# Patient Record
Sex: Female | Born: 1950
Health system: Southern US, Community
[De-identification: ages and names within clinical notes are randomized; demographics above are authoritative.]

## PROBLEM LIST (undated history)

## (undated) DIAGNOSIS — I10 Essential (primary) hypertension: Secondary | ICD-10-CM

## (undated) DIAGNOSIS — E7800 Pure hypercholesterolemia, unspecified: Secondary | ICD-10-CM

## (undated) DIAGNOSIS — I639 Cerebral infarction, unspecified: Secondary | ICD-10-CM

## (undated) DIAGNOSIS — E119 Type 2 diabetes mellitus without complications: Secondary | ICD-10-CM

## (undated) HISTORY — PX: HEMORRHOID SURGERY: SHX153

## (undated) HISTORY — DX: Type 2 diabetes mellitus without complications: E11.9

## (undated) HISTORY — DX: Cerebral infarction, unspecified: I63.9

## (undated) HISTORY — DX: Essential (primary) hypertension: I10

---

## 2007-09-27 ENCOUNTER — Emergency Department (HOSPITAL_COMMUNITY): Admission: EM | Admit: 2007-09-27 | Discharge: 2007-09-27 | Payer: Self-pay | Admitting: Emergency Medicine

## 2011-04-15 ENCOUNTER — Other Ambulatory Visit (HOSPITAL_COMMUNITY): Payer: Self-pay | Admitting: Nurse Practitioner

## 2011-04-15 DIAGNOSIS — R1011 Right upper quadrant pain: Secondary | ICD-10-CM

## 2011-04-20 ENCOUNTER — Ambulatory Visit (HOSPITAL_COMMUNITY)
Admission: RE | Admit: 2011-04-20 | Discharge: 2011-04-20 | Disposition: A | Discharge status: Home / Self Care | Payer: Self-pay | Source: Ambulatory Visit | Attending: Nurse Practitioner | Admitting: Nurse Practitioner

## 2011-04-20 DIAGNOSIS — Q619 Cystic kidney disease, unspecified: Secondary | ICD-10-CM | POA: Insufficient documentation

## 2011-04-20 DIAGNOSIS — R1011 Right upper quadrant pain: Secondary | ICD-10-CM | POA: Insufficient documentation

## 2013-08-27 ENCOUNTER — Encounter: Payer: Self-pay | Admitting: Gastroenterology

## 2017-11-16 ENCOUNTER — Emergency Department: Payer: BLUE CROSS/BLUE SHIELD

## 2017-11-16 ENCOUNTER — Emergency Department
Admission: EM | Admit: 2017-11-16 | Discharge: 2017-11-16 | Disposition: A | Discharge status: Home / Self Care | Payer: BLUE CROSS/BLUE SHIELD | Attending: Emergency Medicine | Admitting: Emergency Medicine

## 2017-11-16 ENCOUNTER — Encounter: Payer: Self-pay | Admitting: Emergency Medicine

## 2017-11-16 DIAGNOSIS — R0789 Other chest pain: Secondary | ICD-10-CM | POA: Insufficient documentation

## 2017-11-16 DIAGNOSIS — R079 Chest pain, unspecified: Secondary | ICD-10-CM | POA: Diagnosis present

## 2017-11-16 HISTORY — DX: Pure hypercholesterolemia, unspecified: E78.00

## 2017-11-16 LAB — CBC
HEMATOCRIT: 44.7 % (ref 36.0–46.0)
HEMOGLOBIN: 15.2 g/dL — AB (ref 12.0–15.0)
MCH: 29.8 pg (ref 26.0–34.0)
MCHC: 34 g/dL (ref 30.0–36.0)
MCV: 87.6 fL (ref 80.0–100.0)
Platelets: 226 10*3/uL (ref 150–400)
RBC: 5.1 MIL/uL (ref 3.87–5.11)
RDW: 11.9 % (ref 11.5–15.5)
WBC: 5.4 10*3/uL (ref 4.0–10.5)
nRBC: 0 % (ref 0.0–0.2)

## 2017-11-16 LAB — BASIC METABOLIC PANEL
Anion gap: 10 (ref 5–15)
BUN: 13 mg/dL (ref 8–23)
CHLORIDE: 99 mmol/L (ref 98–111)
CO2: 26 mmol/L (ref 22–32)
CREATININE: 0.69 mg/dL (ref 0.44–1.00)
Calcium: 9.6 mg/dL (ref 8.9–10.3)
GFR calc Af Amer: >60 mL/min (ref >60)
GLUCOSE: 208 mg/dL — AB (ref 70–99)
Potassium: 3.7 mmol/L (ref 3.5–5.1)
Sodium: 135 mmol/L (ref 135–145)

## 2017-11-16 LAB — TROPONIN I
Troponin I: <0.03 ng/mL (ref <0.03)
Troponin I: <0.03 ng/mL (ref <0.03)

## 2017-11-16 MED ORDER — PREDNISONE 20 MG PO TABS
60.0000 mg | ORAL_TABLET | Freq: Once | ORAL | Status: AC
  Administered 2017-11-16: 60 mg via ORAL
  Filled 2017-11-16: qty 3

## 2017-11-16 MED ORDER — TRAMADOL HCL 50 MG PO TABS: 50.0000 mg | ORAL_TABLET | Freq: Four times a day (QID) | ORAL | 0 refills | Status: DC | PRN

## 2017-11-16 MED ORDER — LORAZEPAM 2 MG/ML IJ SOLN
0.5000 mg | Freq: Once | INTRAMUSCULAR | Status: AC
  Administered 2017-11-16: 0.5 mg via INTRAVENOUS
  Filled 2017-11-16: qty 1

## 2017-11-16 MED ORDER — OXYCODONE-ACETAMINOPHEN 5-325 MG PO TABS
1.0000 | ORAL_TABLET | Freq: Once | ORAL | Status: AC
  Administered 2017-11-16: 1 via ORAL
  Filled 2017-11-16: qty 1

## 2017-11-16 MED ORDER — KETOROLAC TROMETHAMINE 30 MG/ML IJ SOLN
30.0000 mg | Freq: Once | INTRAMUSCULAR | Status: AC
  Administered 2017-11-16: 30 mg via INTRAVENOUS
  Filled 2017-11-16: qty 1

## 2017-11-16 MED ORDER — DIAZEPAM 5 MG PO TABS: 5.0000 mg | ORAL_TABLET | Freq: Three times a day (TID) | ORAL | 0 refills | Status: DC | PRN

## 2017-11-16 MED ORDER — ONDANSETRON HCL 4 MG/2ML IJ SOLN
4.0000 mg | Freq: Once | INTRAMUSCULAR | Status: AC
  Administered 2017-11-16: 4 mg via INTRAVENOUS
  Filled 2017-11-16: qty 2

## 2017-11-16 NOTE — ED Provider Notes (Signed)
[facility] Emergency Department Provider Note       Time seen: ----------------------------------------- 9:49 AM on 11/16/2017 -----------------------------------------   I have reviewed the triage vital signs and the nursing notes.  HISTORY   Chief Complaint Chest Pain and Shortness of Breath    HPI Tammy Ramos is a 67 y.o. female with a history of hyperlipidemia who presents to the ED for pain in the mid chest that radiates to the left shoulder with some shortness of breath.  Patient arrives from [facility] with chest pain for the past 2 months, sometimes it is worse than others.  Past Medical History:  Diagnosis Date  . High cholesterol     There are no active problems to display for this patient.   History reviewed. No pertinent surgical history.  Allergies Patient has no allergy information on record.  Social History Social History   Tobacco Use  . Smoking status: Not on file  Substance Use Topics  . Alcohol use: Not on file  . Drug use: Not on file   Review of Systems Constitutional: Negative for fever. Cardiovascular: Positive for chest pain Respiratory: Negative for shortness of breath. Gastrointestinal: Negative for abdominal pain, vomiting and diarrhea. Musculoskeletal: Positive left shoulder pain and back pain Skin: Negative for rash. Neurological: Negative for headaches, focal weakness or numbness.  All systems negative/normal/unremarkable except as stated in the HPI  ____________________________________________   PHYSICAL EXAM:  VITAL SIGNS: ED Triage Vitals  Enc Vitals Group     BP 11/16/17 0926 (!) 187/95     Pulse Rate 11/16/17 0926 66     Resp 11/16/17 0926 20     Temp 11/16/17 0926 98 F (36.7 C)     Temp Source 11/16/17 0926 Oral     SpO2 11/16/17 0926 97 %     Weight 11/16/17 0923 143 lb (64.9 kg)     Height 11/16/17 0923 4\' 10"  (1.473 m)     Head Circumference --      Peak Flow --      Pain  Score 11/16/17 0923 9     Pain Loc --      Pain Edu? --      Excl. in GC? --    Constitutional: Alert and oriented. Well appearing and in no distress. Eyes: Conjunctivae are normal. Normal extraocular movements. ENT   Head: Normocephalic and atraumatic.   Nose: No congestion/rhinnorhea.   Mouth/Throat: Mucous membranes are moist.   Neck: No stridor. Cardiovascular: Normal rate, regular rhythm. No murmurs, rubs, or gallops. Respiratory: Normal respiratory effort without tachypnea nor retractions. Breath sounds are clear and equal bilaterally. No wheezes/rales/rhonchi. Gastrointestinal: Soft and nontender. Normal bowel sounds Musculoskeletal: Nontender with normal range of motion in extremities. No lower extremity tenderness nor edema. Neurologic:  Normal speech and language. No gross focal neurologic deficits are appreciated.  Skin:  Skin is warm, dry and intact. No rash noted. Psychiatric: Mood and affect are normal. Speech and behavior are normal.  ____________________________________________  EKG: Interpreted by me.  Sinus rhythm the rate of 64 bpm, left axis deviation, normal QT.  ____________________________________________  ED COURSE:  As part of my medical decision making, I reviewed the following data within the electronic MEDICAL RECORD NUMBER History obtained from family if available, nursing notes, old chart and ekg, as well as notes from prior ED visits. Patient presented for chest and shoulder pain, we will assess with labs and imaging as indicated at this time.   Procedures ____________________________________________  LABS (pertinent positives/negatives)  Labs Reviewed  BASIC METABOLIC PANEL - Abnormal; Notable for the following components:      Result Value   Glucose, Bld 208 (*)    All other components within normal limits  CBC - Abnormal; Notable for the following components:   Hemoglobin 15.2 (*)    All other components within normal limits   TROPONIN I  TROPONIN I    RADIOLOGY Images were viewed by me  Chest x-ray IMPRESSION: No active cardiopulmonary disease. ____________________________________________  DIFFERENTIAL DIAGNOSIS   Musculoskeletal pain, GERD, MI, cervical radiculopathy, dissection  FINAL ASSESSMENT AND PLAN  Chest pain   Plan: The patient had presented for chest and shoulder pain for the last 2 months. Patient's labs did not reveal any acute process, repeat troponin was negative. Patient's imaging was also negative.  Some of her symptoms seem to be anxiety related, she is cleared for outpatient follow-up.   Ulice Dash, MD   Note: This note was generated in part or whole with voice recognition software. Voice recognition is usually quite accurate but there are transcription errors that can and very often do occur. I apologize for any typographical errors that were not detected and corrected.     Emily Filbert, MD 11/16/17 [phone]

## 2017-11-16 NOTE — ED Triage Notes (Signed)
Pt c/o pain to mid chest that radiates to left shoulder and some shortness of breath.

## 2017-11-16 NOTE — ED Notes (Signed)
Pt verbalized husband would be driving her home.

## 2017-11-16 NOTE — ED Notes (Signed)
First Nurse Note: Patient to ED from [facility] with chest pain X 2 mos. Sometimes worse than others.  Alert and oriented.  Color good.

## 2018-02-20 ENCOUNTER — Ambulatory Visit (HOSPITAL_BASED_OUTPATIENT_CLINIC_OR_DEPARTMENT_OTHER): Payer: BLUE CROSS/BLUE SHIELD | Admitting: Physical Medicine & Rehabilitation

## 2018-02-20 ENCOUNTER — Encounter: Payer: Self-pay | Admitting: Physical Medicine & Rehabilitation

## 2018-02-20 ENCOUNTER — Encounter: Payer: Medicare Other | Attending: Physical Medicine & Rehabilitation

## 2018-02-20 VITALS — BP 147/76 | HR 67 | Ht 61.0 in | Wt 140.0 lb

## 2018-02-20 DIAGNOSIS — Z8673 Personal history of transient ischemic attack (TIA), and cerebral infarction without residual deficits: Secondary | ICD-10-CM | POA: Diagnosis present

## 2018-02-20 NOTE — Progress Notes (Signed)
Subjective:    Patient ID: Tammy Ramos, female    DOB: 06/10/1950, 68 y.o.   MRN: [id number] Referred by PCP Dr Wynelle Link, reviewed notes from [facility] admission 12/16/2017 HPI 68 yo female with hx HTN, HLD, presented to ED [facility] 11/15/2019Pt reports having a CVA in November 2019 causing problem with speech .  She was admitted to acute care and evaluated by neurology. MRI 12/16/2017- Left internal capsule infarct EKG - NSR. Placed on ASA and Plavix.  Both [place] and [place] language felt slow.  Did not do speech therapy.  No walking problems after stroke.  Pt able to dress and feed herself  She lives with her husband She is independent with all self care and mobility Walking for exercise and doing calisthenics  Pt feels hot in face and head but no fever when she takes temp Takes 1/2 BP pill now since BP was too low.  Started taking "Emperor's blend" by [employer] and is asking for advise on this preparation, read label , ~10 ingredients  Going  To [place] in October Pain Inventory Average Pain 0 Pain Right Now 0 My pain is intermittent and burning  In the last 24 hours, has pain interfered with the following? General activity 0 Relation with others 0 Enjoyment of life 0 What TIME of day is your pain at its worst? daytime Sleep (in general) Fair  Pain is worse with: na Pain improves with: na Relief from Meds: na  Mobility walk without assistance walk with assistance  Function retired  Neuro/Psych dizziness  Prior Studies Any changes since last visit?  no  Physicians involved in your care Any changes since last visit?  no   History reviewed. No pertinent family history. Social History   Socioeconomic History  . Marital status: Married    Spouse name: Not on file  . Number of children: Not on file  . Years of education: Not on file  . Highest education level: Not on file  Occupational History  . Not on file  Social Needs  . Financial resource  strain: Not on file  . Food insecurity:    Worry: Not on file    Inability: Not on file  . Transportation needs:    Medical: Not on file    Non-medical: Not on file  Tobacco Use  . Smoking status: Never Smoker  . Smokeless tobacco: Never Used  Substance and Sexual Activity  . Alcohol use: Never    Frequency: Never  . Drug use: Never  . Sexual activity: Not on file  Lifestyle  . Physical activity:    Days per week: Not on file    Minutes per session: Not on file  . Stress: Not on file  Relationships  . Social connections:    Talks on phone: Not on file    Gets together: Not on file    Attends religious service: Not on file    Active member of club or organization: Not on file    Attends meetings of clubs or organizations: Not on file    Relationship status: Not on file  Other Topics Concern  . Not on file  Social History Narrative  . Not on file   History reviewed. No pertinent surgical history. Past Medical History:  Diagnosis Date  . High cholesterol   . Stroke (HCC)    BP (!) 147/76   Pulse 67   Ht 5\' 1"  (1.549 m)   Wt 140 lb (63.5 kg)  SpO2 98%   BMI 26.45 kg/m   Opioid Risk Score:   Fall Risk Score:  `1  Depression screen PHQ 2/9  No flowsheet data found.   Review of Systems  Constitutional: Negative.   HENT: Negative.   Eyes: Negative.   Respiratory: Negative.   Cardiovascular: Negative.   Gastrointestinal: Negative.   Endocrine: Negative.   Genitourinary: Negative.   Musculoskeletal: Negative.   Skin: Negative.   Allergic/Immunologic: Negative.   Neurological: Positive for dizziness and headaches.  Hematological: Negative.   Psychiatric/Behavioral: Negative.   All other systems reviewed and are negative.      Objective:   Physical Exam Vitals signs and nursing note reviewed. Exam conducted with a chaperone present (husband).  Constitutional:      Appearance: Normal appearance.  HENT:     Head: Normocephalic and atraumatic.      Nose: Nose normal. No congestion or rhinorrhea.     Mouth/Throat:     Mouth: Mucous membranes are dry.  Eyes:     Extraocular Movements: Extraocular movements intact.     Conjunctiva/sclera: Conjunctivae normal.     Pupils: Pupils are equal, round, and reactive to light.  Neck:     Musculoskeletal: Normal range of motion.  Cardiovascular:     Rate and Rhythm: Normal rate and regular rhythm.     Heart sounds: Normal heart sounds. No murmur.  Pulmonary:     Effort: Pulmonary effort is normal.     Breath sounds: Normal breath sounds. No stridor. No wheezing, rhonchi or rales.  Abdominal:     General: Abdomen is flat. Bowel sounds are normal. There is no distension.     Palpations: Abdomen is soft.  Musculoskeletal: Normal range of motion.     Right shoulder: Normal.     Left shoulder: Normal.  Skin:    General: Skin is warm and dry.  Neurological:     General: No focal deficit present.     Mental Status: She is alert and oriented to person, place, and time.     Motor: Motor function is intact. No weakness, tremor or abnormal muscle tone.     Coordination: Finger-Nose-Finger Test normal. Rapid alternating movements normal.     Gait: Gait and tandem walk normal.     Comments: 5/5 strenght in BUE and BLE  Normal sensation in BUE and BLE to LT  Psychiatric:        Mood and Affect: Mood normal.        Behavior: Behavior normal.           Assessment & Plan:  1.  Left internal capsule infarct with dysarthria which has largely resolved.  Her main residual complaint is head feeling warm.  I reviewed her meds and discussed that since I cannot determine the effects of the herbal blend , that she should discontinue as it may have a vasodilator effect. The pt had to reduce her BP med by 1/2 to help with excessively low BP and her systolic is in hi normal range today Her d/c summary recommended discontinuing plavix after 3 wks.  Her complaints may be due to a med side effect  Her head  and facial complaints may represent post stroke HA, and given her language barrier , she may not express it as such. Will re eval pt after 1 month  If no better after med changes listed above may consider trial of topirimate  Discussed with pt and husband who agree with plan , no PT, OT,  or SLP needed

## 2018-02-20 NOTE — Patient Instructions (Addendum)
Stop [place] blend Herb  Stop Clopidigrel and start Aspirin 325mg  (1 regular strength or 4 baby aspirin )  Take 1/2 tablet ambien at bedtime and take the other half if you wake up  If not much better in 1 month may try a new medicine for headache call topirimate

## 2018-03-20 ENCOUNTER — Encounter: Payer: Medicare Other | Attending: Physical Medicine & Rehabilitation

## 2018-03-20 ENCOUNTER — Other Ambulatory Visit: Payer: Self-pay

## 2018-03-20 ENCOUNTER — Ambulatory Visit: Payer: BLUE CROSS/BLUE SHIELD | Admitting: Physical Medicine & Rehabilitation

## 2018-03-20 ENCOUNTER — Encounter: Payer: Self-pay | Admitting: Physical Medicine & Rehabilitation

## 2018-03-20 VITALS — BP 170/80 | HR 72 | Ht 61.0 in | Wt 134.0 lb

## 2018-03-20 DIAGNOSIS — Z8673 Personal history of transient ischemic attack (TIA), and cerebral infarction without residual deficits: Secondary | ICD-10-CM | POA: Insufficient documentation

## 2018-03-20 DIAGNOSIS — R269 Unspecified abnormalities of gait and mobility: Secondary | ICD-10-CM

## 2018-03-20 DIAGNOSIS — I69398 Other sequelae of cerebral infarction: Secondary | ICD-10-CM | POA: Diagnosis not present

## 2018-03-20 NOTE — Patient Instructions (Addendum)
Please ask about a neurology referral from Dr Wynelle Link   Please tell Dr Wynelle Link about the chest pain you had at night on Feb 8  You are doing very well from a stroke recovery standpoint and no longer require rehabilitation

## 2018-03-20 NOTE — Progress Notes (Signed)
Subjective:    Patient ID: Tammy Ramos, female    DOB: August 31, 1950, 68 y.o.   MRN: [id number]  Chinese interpreter present 68 yo female with hx HTN, HLD, presented to ED [facility] 11/15/2019Pt reports having a CVA in November 2019 causing problem with speech .  She was admitted to acute care and evaluated by neurology. MRI 12/16/2017- Left internal capsule infarct EKG - NSR. Placed on ASA and Plavix.    No walking problems after stroke.  HPI Mod I ADL and mobility  No walking issues except for LOB without fall  Mild HA not on a daily basis, always right side of head, overall improved  Right shoulder gets tired, otherwise denies weakness.  Had one episode of chest pain on February 8 in the evening.  She did not let her primary care know about this however she has appointment with her primary care today.  She has no recurrence.  No chest pain with walking   Pain Inventory Average Pain 3 Pain Right Now 3 My pain is sharp  In the last 24 hours, has pain interfered with the following? General activity 3 Relation with others 3 Enjoyment of life 3 What TIME of day is your pain at its worst? daytime Sleep (in general) Fair  Pain is worse with: na Pain improves with: na Relief from Meds: na  Mobility walk without assistance ability to climb steps?  yes do you drive?  yes  Function not employed: date last employed na  Neuro/Psych numbness  Prior Studies Any changes since last visit?  no  Physicians involved in your care Primary care Dr. Wynelle Link   No family history on file. Social History   Socioeconomic History  . Marital status: Married    Spouse name: Not on file  . Number of children: Not on file  . Years of education: Not on file  . Highest education level: Not on file  Occupational History  . Not on file  Social Needs  . Financial resource strain: Not on file  . Food insecurity:    Worry: Not on file    Inability: Not on file  . Transportation needs:      Medical: Not on file    Non-medical: Not on file  Tobacco Use  . Smoking status: Never Smoker  . Smokeless tobacco: Never Used  Substance and Sexual Activity  . Alcohol use: Never    Frequency: Never  . Drug use: Never  . Sexual activity: Not on file  Lifestyle  . Physical activity:    Days per week: Not on file    Minutes per session: Not on file  . Stress: Not on file  Relationships  . Social connections:    Talks on phone: Not on file    Gets together: Not on file    Attends religious service: Not on file    Active member of club or organization: Not on file    Attends meetings of clubs or organizations: Not on file    Relationship status: Not on file  Other Topics Concern  . Not on file  Social History Narrative  . Not on file   No past surgical history on file. Past Medical History:  Diagnosis Date  . High cholesterol   . Stroke (HCC)    BP (!) 170/80   Pulse 72   Ht 5\' 1"  (1.549 m)   Wt 134 lb (60.8 kg)   SpO2 97%   BMI 25.32 kg/m  Opioid Risk Score:   Fall Risk Score:  `1  Depression screen PHQ 2/9  Depression screen [facility] 2/9 03/20/2018 02/20/2018  Decreased Interest 1 3  Down, Depressed, Hopeless 1 0  PHQ - 2 Score 2 3  Altered sleeping - 3  Tired, decreased energy - 3  Change in appetite - 3  Feeling bad or failure about yourself  - 1  Trouble concentrating - 0  Moving slowly or fidgety/restless - 3  Suicidal thoughts - 0  PHQ-9 Score - 16  Difficult doing work/chores - Somewhat difficult     Review of Systems  Constitutional: Positive for unexpected weight change.  HENT: Negative.   Eyes: Negative.   Respiratory: Negative.   Cardiovascular: Negative.   Gastrointestinal: Negative.   Endocrine: Negative.   Genitourinary: Negative.   Musculoskeletal: Negative.   Skin: Negative.   Allergic/Immunologic: Negative.   Neurological: Negative.   Hematological: Negative.   Psychiatric/Behavioral: Negative.   All other systems reviewed and  are negative.      Objective:   Physical Exam  Well-developed well-nourished female no acute distress Mood and affect are appropriate Motor strength is 5/5 bilateral deltoid, bicep, tricep, grip, hip flexor, knee extensor, ankle dorsiflexor Sensation report is equal bilateral upper and lower extremities as well as facial. Gait is without evidence of toe drag or knee instability she has difficulty with tandem gait Romberg is negative      Assessment & Plan:  1.  History of left internal capsule infarct.  Her right hemiparesis has largely cleared.  She still has occasional headaches but these are improving and mild and intermittent at this point. She does not require any further inpatient or outpatient therapy. I do not think she needs any headache prophylactic treatment. I will see her back on as-needed basis She will need to follow-up with primary care in should inform him about her episode of chest pain.  I would also recommend that the patient sees a neurologist in follow-up

## 2018-09-13 ENCOUNTER — Telehealth: Payer: Self-pay

## 2018-09-13 NOTE — Telephone Encounter (Signed)
Copied from [place] [id number]. Topic: General - Other >> Sep 13, 2018  1:52 PM Leward Quan A wrote: Reason for CRM: Patient friend called to inform that for her appointment she will need an interpreter for [place] for her appointment at her visit on 10/02/2018. Friend for any questions please call [place] Ph# [phone]

## 2018-09-20 ENCOUNTER — Encounter: Payer: Self-pay | Admitting: Gastroenterology

## 2018-10-02 ENCOUNTER — Encounter: Payer: Self-pay | Admitting: Family Medicine

## 2018-10-02 ENCOUNTER — Other Ambulatory Visit: Payer: Self-pay

## 2018-10-02 ENCOUNTER — Ambulatory Visit (INDEPENDENT_AMBULATORY_CARE_PROVIDER_SITE_OTHER): Payer: Medicare Other | Admitting: Family Medicine

## 2018-10-02 VITALS — BP 124/86 | HR 69 | Temp 98.7°F | Ht 61.0 in | Wt 131.0 lb

## 2018-10-02 DIAGNOSIS — Z8673 Personal history of transient ischemic attack (TIA), and cerebral infarction without residual deficits: Secondary | ICD-10-CM | POA: Diagnosis not present

## 2018-10-02 DIAGNOSIS — R739 Hyperglycemia, unspecified: Secondary | ICD-10-CM | POA: Diagnosis not present

## 2018-10-02 DIAGNOSIS — I10 Essential (primary) hypertension: Secondary | ICD-10-CM

## 2018-10-02 DIAGNOSIS — R5383 Other fatigue: Secondary | ICD-10-CM

## 2018-10-02 DIAGNOSIS — G609 Hereditary and idiopathic neuropathy, unspecified: Secondary | ICD-10-CM | POA: Diagnosis not present

## 2018-10-02 LAB — MICROALBUMIN / CREATININE URINE RATIO
Creatinine,U: 20.1 mg/dL
Microalb Creat Ratio: 4.9 mg/g (ref 0.0–30.0)
Microalb, Ur: 1 mg/dL (ref 0.0–1.9)

## 2018-10-02 NOTE — Assessment & Plan Note (Signed)
At goal.  Continue valsartan 80 mg daily.

## 2018-10-02 NOTE — Patient Instructions (Signed)
It was very nice to see you today!  I think you are having neuropathy.  We will check blood work and a urine sample today and call you with the results once they are available.  Take care, Dr Jerline Pain  Please try these tips to maintain a healthy lifestyle:   Eat at least 3 REAL meals and 1-2 snacks per day.  Aim for no more than 5 hours between eating.  If you eat breakfast, please do so within one hour of getting up.    Obtain twice as many fruits/vegetables as protein or carbohydrate foods for both lunch and dinner. (Half of each meal should be fruits/vegetables, one quarter protein, and one quarter starchy carbs)   Cut down on sweet beverages. This includes juice, soda, and sweet tea.    Exercise at least 150 minutes every week.

## 2018-10-02 NOTE — Progress Notes (Signed)
Chief Complaint:  Tammy Ramos is a 68 y.o. female who presents today with a chief complaint of Ramos. History is provided by the patient via [place] interpretor.   Assessment/Plan:  Neuropathic Ramos Most the patient's symptoms seem to be consistent with neuropathic type Ramos distributed along upper and lower extremities.  She had a random glucose of over 200 about a year ago but patient does not think that she is ever been checked for diabetes.  Is possible that this could explain a significant number of her symptoms.  She could also potentially have other causes of neuropathy.  Is also possible that her stroke last year could be playing a role as well.  Will check CBC, C met, TSH, and A1c.  Depending on results, would consider trial of gabapentin to help with neuropathic Ramos and sleep difficulties.  Will likely need treatment for elevated blood sugars as well.  Essential hypertension At goal.  Continue valsartan 80 mg daily.  History of stroke Continue atorvastatin 40 mg daily. Will likely need antiplatelet as well depending on blood work.     Subjective:  HPI:  Patient suffered a stroke about a year ago. Since then she has had a multitude of symptoms that have been episodic in nature. Some of the symptoms she has experienced include soreness and burning in her upper back. The Ramos is described as a burning like sensation. She has also had similar symptoms in her hands and feet. The Ramos can sometimes be so severe that it keeps her from sleeping. She has also had some headaches.  No weakness.  She has lost about 30 pounds over the past year.  She also has some itchiness in her eyes and some difficulty focusing.  She has had some decreased coordination as well.  Also with increased frequency of urination and increased "bubbles" in her urine.  She has not tried anything for this.  She had previously evaluated by her previous physician who told her that she was low in sodium and that she needed  to eat more salt.  No other obvious alleviating or aggravating factors.  Her stable, chronic medical conditions are outlined below:  # History of CVA in 2019 - On lipitor 52m daily  # Essential Hypertension - On valsartan 874mdaily and tolerating well  - ROS: No reported chest Ramos or shortness of breath  ROS: Per HPI, otherwise a complete review of systems was negative.   PMH:  The following were reviewed and entered/updated in epic: Past Medical History:  Diagnosis Date   High cholesterol    Stroke ([facility]   Patient Active Problem List   Diagnosis Date Noted   History of stroke 10/02/2018   Essential hypertension 10/02/2018   History reviewed. No pertinent surgical history.  Family History  Problem Relation Age of Onset   Heart attack Neg Hx    Stroke Neg Hx     Medications- reviewed and updated Current Outpatient Medications  Medication Sig Dispense Refill   atorvastatin (LIPITOR) 40 MG tablet Take 40 mg by mouth at bedtime.     valsartan (DIOVAN) 80 MG tablet Take 80 mg by mouth daily.     No current facility-administered medications for this visit.     Allergies-reviewed and updated No Known Allergies  Social History   Socioeconomic History   Marital status: Married    Spouse name: Not on file   Number of children: Not on file   Years of education: Not on file  Highest education level: Not on file  Occupational History   Not on file  Social Needs   Financial resource strain: Not on file   Food insecurity    Worry: Not on file    Inability: Not on file   Transportation needs    Medical: Not on file    Non-medical: Not on file  Tobacco Use   Smoking status: Never Smoker   Smokeless tobacco: Never Used  Substance and Sexual Activity   Alcohol use: Never    Frequency: Never   Drug use: Never   Sexual activity: Not on file  Lifestyle   Physical activity    Days per week: Not on file    Minutes per session: Not on file     Stress: Not on file  Relationships   Social connections    Talks on phone: Not on file    Gets together: Not on file    Attends religious service: Not on file    Active member of club or organization: Not on file    Attends meetings of clubs or organizations: Not on file    Relationship status: Not on file  Other Topics Concern   Not on file  Social History Narrative   Not on file          Objective:  Physical Exam: BP 124/86 (BP Location: Left Arm, Patient Position: Sitting, Cuff Size: Normal)    Pulse 69    Temp 98.7 F (37.1 C)    Ht '5\' 1"'  (1.549 m)    Wt 131 lb (59.4 kg)    SpO2 97%    BMI 24.75 kg/m   Gen: NAD, resting comfortably CV: Regular rate and rhythm with no murmurs appreciated Pulm: Normal work of breathing, clear to auscultation bilaterally with no crackles, wheezes, or rhonchi GI: Normal bowel sounds present. Soft, Nontender, Nondistended. MSK: No edema, cyanosis, or clubbing noted Skin: Warm, dry Neuro: Grossly normal, moves all extremities Psych: Normal affect and thought content      Tammy Ramos M. Tammy Pain, MD 10/02/2018 2:52 PM

## 2018-10-02 NOTE — Assessment & Plan Note (Signed)
Continue atorvastatin 40 mg daily. Will likely need antiplatelet as well depending on blood work.

## 2018-10-03 LAB — CBC
HCT: 40.9 % (ref 36.0–46.0)
Hemoglobin: 14.1 g/dL (ref 12.0–15.0)
MCHC: 34.5 g/dL (ref 30.0–36.0)
MCV: 89.2 fl (ref 78.0–100.0)
Platelets: 223 10*3/uL (ref 150.0–400.0)
RBC: 4.59 Mil/uL (ref 3.87–5.11)
RDW: 13.1 % (ref 11.5–15.5)
WBC: 6 10*3/uL (ref 4.0–10.5)

## 2018-10-03 LAB — COMPREHENSIVE METABOLIC PANEL
ALT: 40 U/L — ABNORMAL HIGH (ref 0–35)
AST: 28 U/L (ref 0–37)
Albumin: 5 g/dL (ref 3.5–5.2)
Alkaline Phosphatase: 53 U/L (ref 39–117)
BUN: 17 mg/dL (ref 6–23)
CO2: 31 mEq/L (ref 19–32)
Calcium: 10.2 mg/dL (ref 8.4–10.5)
Chloride: 101 mEq/L (ref 96–112)
Creatinine, Ser: 0.76 mg/dL (ref 0.40–1.20)
GFR: 76.18 mL/min (ref >60.00)
Glucose, Bld: 113 mg/dL — ABNORMAL HIGH (ref 70–99)
Potassium: 4.2 mEq/L (ref 3.5–5.1)
Sodium: 139 mEq/L (ref 135–145)
Total Bilirubin: 0.7 mg/dL (ref 0.2–1.2)
Total Protein: 8.1 g/dL (ref 6.0–8.3)

## 2018-10-03 LAB — TSH: TSH: 0.82 u[IU]/mL (ref 0.35–4.50)

## 2018-10-03 LAB — VITAMIN B12: Vitamin B-12: 672 pg/mL (ref 211–911)

## 2018-10-03 LAB — HEMOGLOBIN A1C: Hgb A1c MFr Bld: 6 % (ref 4.6–6.5)

## 2018-10-03 NOTE — Progress Notes (Signed)
Please inform patient of the following:  Her blood sugar is elevated. This could potentially explain some of her neuropathy. Would like for her to start metformin 750mg  daily to reduce blood sugars. Also would like for her to start gabapentin 300mg  nightly - this should help with her symptoms.  If her symptoms are not improving in a few weeks, would like for her to let us know and we can get her in to see the neurologist.  Algis Greenhouse. Jerline Pain, MD 10/03/2018 1:09 PM

## 2018-10-06 ENCOUNTER — Other Ambulatory Visit: Payer: Self-pay

## 2018-10-06 ENCOUNTER — Telehealth: Payer: Self-pay | Admitting: Physical Therapy

## 2018-10-06 MED ORDER — GABAPENTIN 300 MG PO CAPS: 300.0000 mg | ORAL_CAPSULE | Freq: Every day | ORAL | 3 refills | Status: DC

## 2018-10-06 MED ORDER — METFORMIN HCL ER 750 MG PO TB24: 750.0000 mg | ORAL_TABLET | Freq: Every day | ORAL | 3 refills | Status: DC

## 2018-10-06 NOTE — Telephone Encounter (Signed)
Copied from [place] [id number]. Topic: General - Other >> Oct 06, 2018  2:39 PM Leward Quan A wrote: Reason for CRM: Patient friend and confidant called  to request a copy of her recent labs to be mailed out to her. Please advise

## 2018-10-06 NOTE — Telephone Encounter (Signed)
Labs have been printed and mailed

## 2018-10-12 ENCOUNTER — Ambulatory Visit: Payer: Self-pay | Admitting: *Deleted

## 2018-10-12 ENCOUNTER — Encounter: Payer: Self-pay | Admitting: Family Medicine

## 2018-10-12 ENCOUNTER — Ambulatory Visit (INDEPENDENT_AMBULATORY_CARE_PROVIDER_SITE_OTHER): Payer: Medicare Other | Admitting: Family Medicine

## 2018-10-12 ENCOUNTER — Other Ambulatory Visit: Payer: Self-pay

## 2018-10-12 VITALS — BP 159/92 | HR 64 | Temp 98.0°F | Ht 61.0 in | Wt 133.0 lb

## 2018-10-12 DIAGNOSIS — Z6825 Body mass index (BMI) 25.0-25.9, adult: Secondary | ICD-10-CM

## 2018-10-12 DIAGNOSIS — M25519 Pain in unspecified shoulder: Secondary | ICD-10-CM | POA: Diagnosis not present

## 2018-10-12 DIAGNOSIS — R197 Diarrhea, unspecified: Secondary | ICD-10-CM

## 2018-10-12 DIAGNOSIS — M792 Neuralgia and neuritis, unspecified: Secondary | ICD-10-CM

## 2018-10-12 DIAGNOSIS — E663 Overweight: Secondary | ICD-10-CM

## 2018-10-12 DIAGNOSIS — R7303 Prediabetes: Secondary | ICD-10-CM | POA: Insufficient documentation

## 2018-10-12 DIAGNOSIS — R0789 Other chest pain: Secondary | ICD-10-CM

## 2018-10-12 DIAGNOSIS — Z8673 Personal history of transient ischemic attack (TIA), and cerebral infarction without residual deficits: Secondary | ICD-10-CM

## 2018-10-12 DIAGNOSIS — I10 Essential (primary) hypertension: Secondary | ICD-10-CM | POA: Diagnosis not present

## 2018-10-12 MED ORDER — PANTOPRAZOLE SODIUM 40 MG PO TBEC: 40.0000 mg | DELAYED_RELEASE_TABLET | Freq: Every day | ORAL | 0 refills | Status: DC

## 2018-10-12 MED ORDER — MELOXICAM 15 MG PO TABS: 15.0000 mg | ORAL_TABLET | Freq: Every day | ORAL | 0 refills | Status: DC

## 2018-10-12 NOTE — Progress Notes (Signed)
Chief Complaint:  Tammy Ramos is a 68 y.o. female who presents today with a chief complaint of diarrhea. History provided by patient via Congohinese interpretor.  Assessment/Plan:  Diarrhea No red flags.  Likely metformin side effect.  Seems to be improving.  Anticipate continued improvement over the next couple of weeks.  Chest wall pain/sternoclavicular joint pain EKG today without red flags.  History not consistent with cardiac etiology.  Will get records from previous chest x-rays.  She does have quite a bit of prominence at her [place] joints bilaterally- likely has underlying arthritis.  Will treat with a course of Mobic for the next 1 to 2 weeks.  We will also start Protonix for GI protection.  Discussed reasons return to care.  Discussed reasons to seek emergent care. Follow up as needed.   Essential hypertension Elevated today.  Home blood pressure readings typically in the 120s over 70s.  She was well controlled at her last visit.  May be elevated today in setting of acute pain.  We will continue home blood pressure monitoring with goal 140/90 or lower for now   Prediabetes Found on blood work from a week ago.  On metformin 750 mg daily.  Neuropathic pain Improving with gabapentin 300 mg daily.  Possibly due to underlying hyperglycemia.  No other obvious explanations on recent blood work.  Consider referral to neurology if symptoms do not respond to gabapentin.   History of stroke Advised pt to take ASA 81mg  daily. Continue lipitor 40mg  daily.      Subjective:  HPI:  Diarrhea Symptoms started about 3 days ago.  She started metformin 6 days ago.  Symptoms have improved significantly today.  She had a couple of days of several episodes of loose and watery bowel movements.  She is also had some associated decreased appetite.  No melena.  No hematochezia.  No vomiting.  No fevers or chills.  No known sick contacts.  Chest discomfort Symptoms started about 6 months ago.  She has a  discomfort sensation in the top part of her chest near her clavicles that radiates into her back, neck, and shoulders.  Symptoms have significantly worsened over last day.  She has had reportedly several chest x-rays and EKGs done in the past with no obvious etiology.  Pain feels similar to prior flares.  She has not tried anything for the pain.  She has had some difficulty with deep breaths.  No other treatments tried.  ROS: Per HPI  PMH: She reports that she has never smoked. She has never used smokeless tobacco. She reports that she does not drink alcohol or use drugs.      Objective:  Physical Exam: BP (!) 159/92   Pulse 64   Temp 98 F (36.7 C)   Ht 5\' 1"  (1.549 m)   Wt 133 lb (60.3 kg)   SpO2 97%   BMI 25.13 kg/m   Gen: NAD, resting comfortably CV: Regular rate and rhythm with no murmurs appreciated Pulm: Normal work of breathing, clear to auscultation bilaterally with no crackles, wheezes, or rhonchi Chest: Bilateral [place] joint prominent and mildly tender to palpation.  Patient identifies this is the area of most of her pain. GI: Normal bowel sounds present. Soft, Nontender, Nondistended. MSK: No edema, cyanosis, or clubbing noted Skin: Warm, dry Neuro: Grossly normal, moves all extremities Psych: Normal affect and thought content  EKG: Sinus bradycardia.  No ischemic changes.  Time Spent: I spent >40 minutes face-to-face with the patient,  with more than half spent on counseling for management plan for her chest wall pain, diarrhea, prediabetes, neuropathic pain, and elevated blood pressure.Algis Greenhouse. Jerline Pain, MD 10/12/2018 4:55 PM

## 2018-10-12 NOTE — Assessment & Plan Note (Signed)
Found on blood work from a week ago.  On metformin 750 mg daily.

## 2018-10-12 NOTE — Telephone Encounter (Signed)
Noted  

## 2018-10-12 NOTE — Telephone Encounter (Signed)
Assisted by Drema Dallas [phone]; pt called with concerns of having allergic reaction to diabetes medication, and nerve inflammation medication (Meformin and Gabapentin); the pt says that she is experiencing: dizziness, mild diarrhea, "chest distress" (described as nausea), blurred vision, and trembling in her hands; she also has an itchy rash on her neck like she has been bitten by insects; the pt has not taken her CBG since she has been home; the pt says that dizziness is the worst of her symptoms; she took Gabapentin 10/11/2018 at bedtime, and Metformin the morning of 10/12/2018; her blurred vision is intermittent, and her dizziness is all of the time; she noticed the rash this morning; Her SBP has been in the 130s; recommendations made per nurse triage; she verbalized understanding; she sees Dr Dimas Chyle, [place]; pt transferred to [facility] for scheduling.   Reason for Disposition . [1] MODERATE dizziness (e.g., interferes with normal activities) AND [2] has NOT been evaluated by physician for this  (Exception: dizziness caused by heat exposure, sudden standing, or poor fluid intake)  Answer Assessment - Initial Assessment Questions 1. DESCRIPTION: "Describe your dizziness."     Light headed 2. LIGHTHEADED: "Do you feel lightheaded?" (e.g., somewhat faint, woozy, weak upon standing)     Yes; All of the time 3. VERTIGO: "Do you feel like either you or the room is spinning or tilting?" (i.e. vertigo)     no 4. SEVERITY: "How bad is it?"  "Do you feel like you are going to faint?" "Can you stand and walk?"   - MILD - walking normally   - MODERATE - interferes with normal activities (e.g., work, school)    - SEVERE - unable to stand, requires support to walk, feels like passing out now.      Mild to moderate ("walks wobbly") 5. ONSET:  "When did the dizziness begin?"     10/06/2018 6. AGGRAVATING FACTORS: "Does anything make it worse?" (e.g., standing, change in head position)  Standing or changing the direction of head worsens 7. HEART RATE: "Can you tell me your heart rate?" "How many beats in 15 seconds?"  (Note: not all patients can do this)    58-65 8. CAUSE: "What do you think is causing the dizziness?"    Allergic reaction to medication 9. RECURRENT SYMPTOM: "Have you had dizziness before?" If so, ask: "When was the last time?" "What happened that time?"    Yes, was intermittent at that time; she can not remember specific time 10. OTHER SYMPTOMS: "Do you have any other symptoms?" (e.g., fever, chest pain, vomiting, diarrhea, bleeding)       Nausea, rash on neck, blurred vision has worsened since taking medication  11. PREGNANCY: "Is there any chance you are pregnant?" "When was your last menstrual period?"      no  Protocols used: DIZZINESS [facility]

## 2018-10-12 NOTE — Assessment & Plan Note (Signed)
Advised pt to take ASA 81mg  daily. Continue lipitor 40mg  daily.

## 2018-10-12 NOTE — Assessment & Plan Note (Signed)
Elevated today.  Home blood pressure readings typically in the 120s over 70s.  She was well controlled at her last visit.  May be elevated today in setting of acute pain.  We will continue home blood pressure monitoring with goal 140/90 or lower for now

## 2018-10-12 NOTE — Assessment & Plan Note (Signed)
Improving with gabapentin 300 mg daily.  Possibly due to underlying hyperglycemia.  No other obvious explanations on recent blood work.  Consider referral to neurology if symptoms do not respond to gabapentin.

## 2018-10-12 NOTE — Patient Instructions (Addendum)
It was very nice to see you today!  I think you are having some inflammation in your chest.  Please start the Mobic.  Please take the Protonix while you are on this medication.  Your other symptoms should improve as your body adjust to the new medications.  Keep an eye on your BP and let me know if persistently 140/90 or higher.   Please let me know if your symptoms worsen or do not improve in the next 1 to 2 weeks.  Take care, Dr Jerline Pain  Please try these tips to maintain a healthy lifestyle:   Eat at least 3 REAL meals and 1-2 snacks per day.  Aim for no more than 5 hours between eating.  If you eat breakfast, please do so within one hour of getting up.    Obtain twice as many fruits/vegetables as protein or carbohydrate foods for both lunch and dinner. (Half of each meal should be fruits/vegetables, one quarter protein, and one quarter starchy carbs)   Cut down on sweet beverages. This includes juice, soda, and sweet tea.    Exercise at least 150 minutes every week.

## 2018-10-20 ENCOUNTER — Ambulatory Visit: Payer: Medicare Other | Admitting: Family Medicine

## 2018-11-04 ENCOUNTER — Other Ambulatory Visit: Payer: Self-pay | Admitting: Family Medicine

## 2018-11-05 ENCOUNTER — Other Ambulatory Visit: Payer: Self-pay | Admitting: Family Medicine

## 2018-12-01 ENCOUNTER — Other Ambulatory Visit: Payer: Self-pay | Admitting: Family Medicine

## 2018-12-01 MED ORDER — VALSARTAN 80 MG PO TABS: 80.0000 mg | ORAL_TABLET | Freq: Every day | ORAL | 0 refills | Status: DC

## 2018-12-01 NOTE — Telephone Encounter (Signed)
See note

## 2018-12-01 NOTE — Telephone Encounter (Signed)
Copied from Benjamin Perez [phone]. Topic: Quick Communication - Rx Refill/Question >> Dec 01, 2018  9:15 AM Leward Quan A wrote: Medication: valsartan (DIOVAN) 80 MG tablet   Patient would like Rx sent in today please  Has the patient contacted their pharmacy? Yes.   (Agent: If no, request that the patient contact the pharmacy for the refill.) (Agent: If yes, when and what did the pharmacy advise?)  Preferred Pharmacy (with phone number or street name): [id number] - [place], [place] [phone] (Phone) [phone] (Fax)    Agent: Please be advised that RX refills may take up to 3 business days. We ask that you follow-up with your pharmacy.

## 2018-12-01 NOTE — Telephone Encounter (Signed)
Requested medication (s) are due for refill today: yes  Requested medication (s) are on the active medication list: yes  Last refill:  09/2018  Future visit scheduled: no  Notes to clinic:  Last filled by historical provider    Requested Prescriptions  Pending Prescriptions Disp Refills   valsartan (DIOVAN) 80 MG tablet       Sig: Take 1 tablet (80 mg total) by mouth daily.     Cardiovascular:  Angiotensin Receptor Blockers Failed - 12/01/2018  9:20 AM      Failed - Last BP in normal range    BP Readings from Last 1 Encounters:  10/12/18 (!) 159/92         Passed - Cr in normal range and within 180 days    Creatinine, Ser  Date Value Ref Range Status  10/02/2018 0.76 0.40 - 1.20 mg/dL Final         Passed - K in normal range and within 180 days    Potassium  Date Value Ref Range Status  10/02/2018 4.2 3.5 - 5.1 mEq/L Final         Passed - Patient is not pregnant      Passed - Valid encounter within last 6 months    Recent Outpatient Visits          1 month ago Diarrhea, unspecified type   [facility], Algis Greenhouse, MD   2 months ago History of stroke   [facility] [address], Algis Greenhouse, MD

## 2018-12-08 ENCOUNTER — Other Ambulatory Visit: Payer: Self-pay

## 2018-12-08 DIAGNOSIS — Z20822 Contact with and (suspected) exposure to covid-19: Secondary | ICD-10-CM

## 2018-12-08 MED ORDER — PANTOPRAZOLE SODIUM 40 MG PO TBEC: 40.0000 mg | DELAYED_RELEASE_TABLET | Freq: Every day | ORAL | 0 refills | Status: DC

## 2018-12-09 LAB — NOVEL CORONAVIRUS, NAA: SARS-CoV-2, NAA: NOT DETECTED

## 2018-12-24 ENCOUNTER — Other Ambulatory Visit: Payer: Self-pay | Admitting: Family Medicine

## 2018-12-27 ENCOUNTER — Other Ambulatory Visit: Payer: Self-pay

## 2018-12-27 MED ORDER — PANTOPRAZOLE SODIUM 40 MG PO TBEC: 40.0000 mg | DELAYED_RELEASE_TABLET | Freq: Every day | ORAL | 0 refills | Status: DC

## 2019-01-08 ENCOUNTER — Other Ambulatory Visit: Payer: Self-pay

## 2019-01-09 ENCOUNTER — Ambulatory Visit (INDEPENDENT_AMBULATORY_CARE_PROVIDER_SITE_OTHER): Payer: Medicare Other | Admitting: Family Medicine

## 2019-01-09 ENCOUNTER — Encounter: Payer: Self-pay | Admitting: Family Medicine

## 2019-01-09 DIAGNOSIS — Z8673 Personal history of transient ischemic attack (TIA), and cerebral infarction without residual deficits: Secondary | ICD-10-CM

## 2019-01-09 DIAGNOSIS — R7303 Prediabetes: Secondary | ICD-10-CM

## 2019-01-09 DIAGNOSIS — M792 Neuralgia and neuritis, unspecified: Secondary | ICD-10-CM

## 2019-01-09 DIAGNOSIS — M1990 Unspecified osteoarthritis, unspecified site: Secondary | ICD-10-CM

## 2019-01-09 DIAGNOSIS — I10 Essential (primary) hypertension: Secondary | ICD-10-CM | POA: Diagnosis not present

## 2019-01-09 MED ORDER — VALSARTAN 80 MG PO TABS: 80.0000 mg | ORAL_TABLET | Freq: Every day | ORAL | 3 refills | Status: DC

## 2019-01-09 MED ORDER — ATORVASTATIN CALCIUM 40 MG PO TABS: 40.0000 mg | ORAL_TABLET | Freq: Every day | ORAL | 3 refills | Status: DC

## 2019-01-09 NOTE — Assessment & Plan Note (Signed)
Doing well with gabapentin 300 mg daily.  We will continue current dose.  Follow-up in the 9 to 12 months.

## 2019-01-09 NOTE — Patient Instructions (Signed)
It was very nice to see you today!  I am glad you are feeling better!  Please try using voltaren gel for your knees. You have a small amount of arthritis.   Come back in 9-12 months for your next check up, or sooner if needed.   Take care, Dr Jerline Pain  Please try these tips to maintain a healthy lifestyle:   Eat at least 3 REAL meals and 1-2 snacks per day.  Aim for no more than 5 hours between eating.  If you eat breakfast, please do so within one hour of getting up.    Obtain twice as many fruits/vegetables as protein or carbohydrate foods for both lunch and dinner. (Half of each meal should be fruits/vegetables, one quarter protein, and one quarter starchy carbs)   Cut down on sweet beverages. This includes juice, soda, and sweet tea.    Exercise at least 150 minutes every week.

## 2019-01-09 NOTE — Assessment & Plan Note (Signed)
At goal.  Will refill valsartan 80 mg daily.

## 2019-01-09 NOTE — Assessment & Plan Note (Signed)
Doing well with Metformin 750 mg daily.  Check A1c with next blood draw.

## 2019-01-09 NOTE — Assessment & Plan Note (Signed)
Continue aspirin and statin. 

## 2019-01-09 NOTE — Progress Notes (Signed)
   Chief Complaint:  Tammy Ramos is a 68 y.o. female who presents today with a chief complaint of hypertension.   Assessment/Plan:  Neuropathic pain Doing well with gabapentin 300 mg daily.  We will continue current dose.  Follow-up in the 9 to 12 months.  Prediabetes Doing well with Metformin 750 mg daily.  Check A1c with next blood draw.  Essential hypertension At goal.  Will refill valsartan 80 mg daily.  History of stroke Continue aspirin and statin.  Osteoarthritis No red flags.  Recommended over-the-counter analgesics as needed.  Also recommend over-the-counter Voltaren.    Subjective:  HPI:  She has been having some pain in knees. Present for years. Stable. Has been walking more.   Her stable, chronic medical conditions are outlined below:  # History of CVA in 2019 - On lipitor 40mg  daily and ASA 81mg  daily  # Prediabetes - On metformin 750mg  daily and tolerating well  # GERD - On protonix 40mg  daily  # Essential Hypertension - On valsartan 80mg  daily and tolerating well  - ROS: No reported chest pain or shortness of breath   ROS: Per HPI  PMH: She reports that she has never smoked. She has never used smokeless tobacco. She reports that she does not drink alcohol or use drugs.      Objective:  Physical Exam: BP 134/78   Pulse 71   Temp 97.9 F (36.6 C)   Ht 5\' 1"  (1.549 m)   Wt 134 lb 3.2 oz (60.9 kg)   SpO2 97%   BMI 25.36 kg/m   Gen: NAD, resting comfortably CV: Regular rate and rhythm with no murmurs appreciated Pulm: Normal work of breathing, clear to auscultation bilaterally with no crackles, wheezes, or rhonchi GI: Normal bowel sounds present. Soft, Nontender, Nondistended. MSK: No edema, cyanosis, or clubbing noted.  Bilateral knees without deformities.  Nontender to palpation.  Crepitus with active and passive range of motion.  Stable to varus and valgus stress.  Neurovascular intact distally. Skin: Warm, dry Neuro: Grossly normal,  moves all extremities Psych: Normal affect and thought content  No results found for this or any previous visit (from the past 24 hour(s)).      Algis Greenhouse. Jerline Pain, MD 01/09/2019 2:40 PM

## 2019-01-09 NOTE — Assessment & Plan Note (Signed)
No red flags.  Recommended over-the-counter analgesics as needed.  Also recommend over-the-counter Voltaren.

## 2019-06-08 DIAGNOSIS — H40033 Anatomical narrow angle, bilateral: Secondary | ICD-10-CM | POA: Diagnosis not present

## 2019-06-08 DIAGNOSIS — H04123 Dry eye syndrome of bilateral lacrimal glands: Secondary | ICD-10-CM | POA: Diagnosis not present

## 2019-08-16 ENCOUNTER — Other Ambulatory Visit: Payer: Self-pay

## 2019-08-16 ENCOUNTER — Ambulatory Visit (INDEPENDENT_AMBULATORY_CARE_PROVIDER_SITE_OTHER): Payer: Medicare Other | Admitting: Family

## 2019-08-16 ENCOUNTER — Encounter: Payer: Self-pay | Admitting: Family

## 2019-08-16 VITALS — BP 124/74 | HR 80 | Temp 96.4°F | Ht 61.0 in | Wt 139.2 lb

## 2019-08-16 DIAGNOSIS — W57XXXA Bitten or stung by nonvenomous insect and other nonvenomous arthropods, initial encounter: Secondary | ICD-10-CM

## 2019-08-16 DIAGNOSIS — L03119 Cellulitis of unspecified part of limb: Secondary | ICD-10-CM

## 2019-08-16 DIAGNOSIS — S90861A Insect bite (nonvenomous), right foot, initial encounter: Secondary | ICD-10-CM | POA: Diagnosis not present

## 2019-08-16 MED ORDER — DOXYCYCLINE HYCLATE 100 MG PO TABS: 100.0000 mg | ORAL_TABLET | Freq: Two times a day (BID) | ORAL | 0 refills | Status: DC

## 2019-08-16 NOTE — Patient Instructions (Signed)

## 2019-08-16 NOTE — Progress Notes (Signed)
Acute Office Visit  Subjective:    Patient ID: Tammy Ramos, female    DOB: [birth year], 69 y.o.   MRN: [id number]  Chief Complaint  Patient presents with  . Insect Bite    both ankles and feet//pt stepped in a ant hill yesterday afternoon and got bites on both feet//very itchy and fluid filled bites    HPI Patient is in today after stepping in an a bed of ants and being bitten several times, 2 days ado. She reports the area being very itchy but noticing pustules on her foot. It has become red, swollen and warm to touch. She has been applying hydrocortisone cream without much relief.   Past Medical History:  Diagnosis Date  . High cholesterol   . Stroke [facility])     History reviewed. No pertinent surgical history.  Family History  Problem Relation Age of Onset  . Heart attack Neg Hx   . Stroke Neg Hx     Social History   Socioeconomic History  . Marital status: Married    Spouse name: Not on file  . Number of children: Not on file  . Years of education: Not on file  . Highest education level: Not on file  Occupational History  . Not on file  Tobacco Use  . Smoking status: Never Smoker  . Smokeless tobacco: Never Used  Substance and Sexual Activity  . Alcohol use: Never  . Drug use: Never  . Sexual activity: Not on file  Other Topics Concern  . Not on file  Social History Narrative  . Not on file   Social Determinants of Health   Financial Resource Strain:   . Difficulty of Paying Living Expenses:   Food Insecurity:   . Worried About Programme researcher, broadcasting/film/video in the Last Year:   . Barista in the Last Year:   Transportation Needs:   . Freight forwarder (Medical):   Marland Kitchen Lack of Transportation (Non-Medical):   Physical Activity:   . Days of Exercise per Week:   . Minutes of Exercise per Session:   Stress:   . Feeling of Stress :   Social Connections:   . Frequency of Communication with Friends and Family:   . Frequency of Social Gatherings with  Friends and Family:   . Attends Religious Services:   . Active Member of Clubs or Organizations:   . Attends Banker Meetings:   Marland Kitchen Marital Status:   Intimate Partner Violence:   . Fear of Current or Ex-Partner:   . Emotionally Abused:   Marland Kitchen Physically Abused:   . Sexually Abused:     Outpatient Medications Prior to Visit  Medication Sig Dispense Refill  . aspirin 81 MG chewable tablet Chew by mouth daily.    Marland Kitchen atorvastatin (LIPITOR) 40 MG tablet Take 1 tablet (40 mg total) by mouth at bedtime. 90 tablet 3  . gabapentin (NEURONTIN) 300 MG capsule Take 1 capsule (300 mg total) by mouth at bedtime. 90 capsule 3  . metFORMIN (GLUCOPHAGE XR) 750 MG 24 hr tablet Take 1 tablet (750 mg total) by mouth daily with breakfast. 90 tablet 3  . valsartan (DIOVAN) 80 MG tablet Take 1 tablet (80 mg total) by mouth daily. 90 tablet 3   No facility-administered medications prior to visit.    No Known Allergies  Review of Systems  Respiratory: Negative.   Cardiovascular: Negative.   Skin: Positive for rash.       Foot  pain and swelling, right  Allergic/Immunologic: Negative.   Neurological: Negative.   All other systems reviewed and are negative.      Objective:    Physical Exam Constitutional:      Appearance: Normal appearance.  Cardiovascular:     Rate and Rhythm: Normal rate and regular rhythm.  Pulmonary:     Effort: Pulmonary effort is normal.     Breath sounds: Normal breath sounds.  Abdominal:     General: Abdomen is flat.  Musculoskeletal:        General: Swelling present.     Right lower leg: Edema present.     Comments: Pustular rash noted to the right foot. Moderate swelling and erythema noted.   Skin:    General: Skin is warm and dry.     Comments: Except as noted to the right foot  Neurological:     Mental Status: She is alert.  Psychiatric:        Mood and Affect: Mood normal.     BP 124/74   Pulse 80   Temp (!) 96.4 F (35.8 C) (Tympanic)   Ht  5\' 1"  (1.549 m)   Wt 139 lb 3.2 oz (63.1 kg)   SpO2 97%   BMI 26.30 kg/m  Wt Readings from Last 3 Encounters:  08/16/19 139 lb 3.2 oz (63.1 kg)  01/09/19 134 lb 3.2 oz (60.9 kg)  10/12/18 133 lb (60.3 kg)    Health Maintenance Due  Topic Date Due  . Hepatitis C Screening  Never done  . COVID-19 Vaccine (1) Never done  . DEXA SCAN  Never done    There are no preventive care reminders to display for this patient.   Lab Results  Component Value Date   TSH 0.82 10/02/2018   Lab Results  Component Value Date   WBC 6.0 10/02/2018   HGB 14.1 10/02/2018   HCT 40.9 10/02/2018   MCV 89.2 10/02/2018   PLT 223.0 10/02/2018   Lab Results  Component Value Date   NA 139 10/02/2018   K 4.2 10/02/2018   CO2 31 10/02/2018   GLUCOSE 113 (H) 10/02/2018   BUN 17 10/02/2018   CREATININE 0.76 10/02/2018   BILITOT 0.7 10/02/2018   ALKPHOS 53 10/02/2018   AST 28 10/02/2018   ALT 40 (H) 10/02/2018   PROT 8.1 10/02/2018   ALBUMIN 5.0 10/02/2018   CALCIUM 10.2 10/02/2018   ANIONGAP 10 11/16/2017   GFR 76.18 10/02/2018   No results found for: CHOL No results found for: HDL No results found for: LDLCALC No results found for: TRIG No results found for: CHOLHDL Lab Results  Component Value Date   HGBA1C 6.0 10/02/2018       Assessment & Plan:   Problem List Items Addressed This Visit    None    Visit Diagnoses    Insect bite of right foot, initial encounter    -  Primary   Cellulitis of foot           Meds ordered this encounter  Medications  . doxycycline (VIBRA-TABS) 100 MG tablet    Sig: Take 1 tablet (100 mg total) by mouth 2 (two) times daily.    Dispense:  14 tablet    Refill:  0   Take Zyrtec 10 mg once daily to help with the itching. Keep the foot raised as much as possible until symptoms resolve  10/04/2018, FNP

## 2019-10-07 ENCOUNTER — Other Ambulatory Visit: Payer: Self-pay | Admitting: Family Medicine

## 2019-11-14 IMAGING — CR DG CHEST 2V
2 series · 2 of 2 positions shown · non-contrast
Comparison: Radiographs May 15, 2012.

CLINICAL DATA: Chest pain.

EXAM:
CHEST - 2 VIEW

[chest pa]
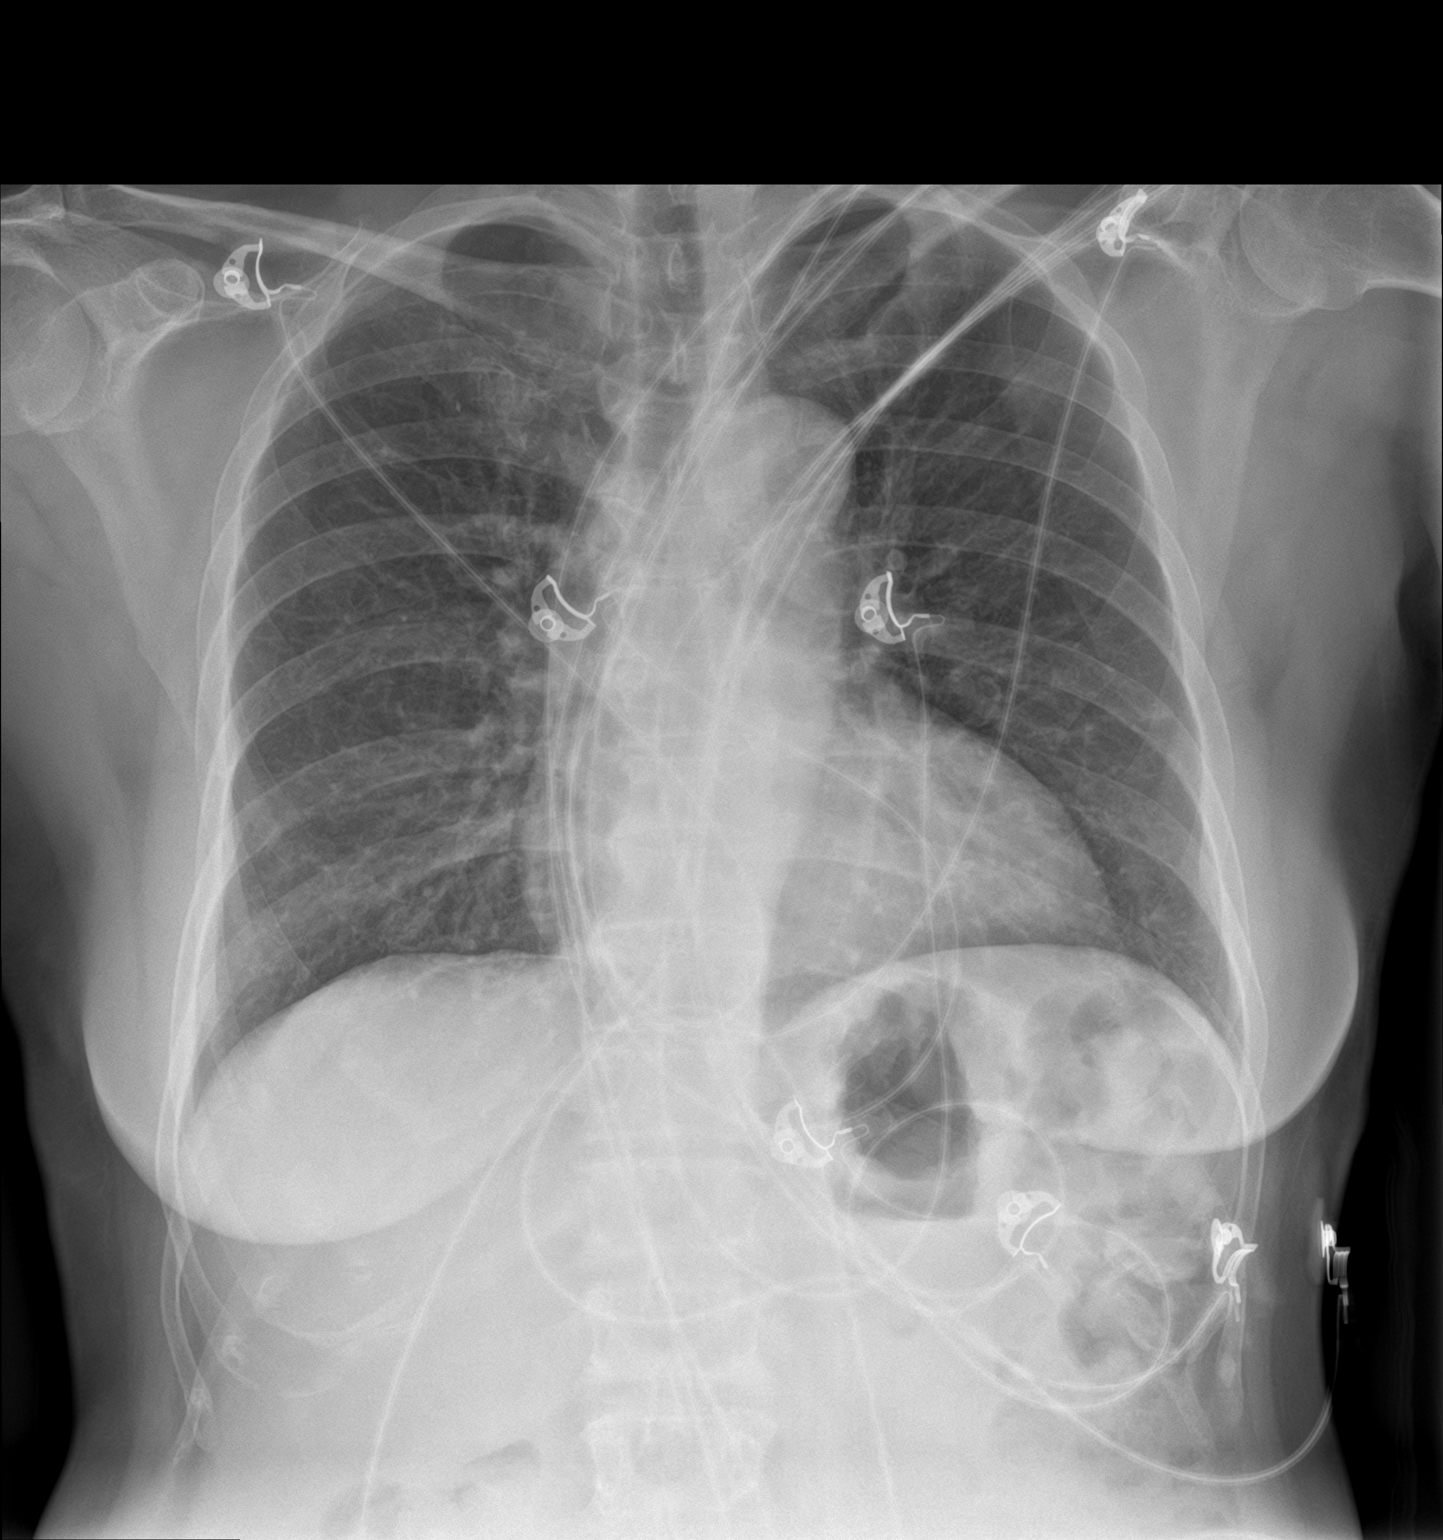

[chest lat]
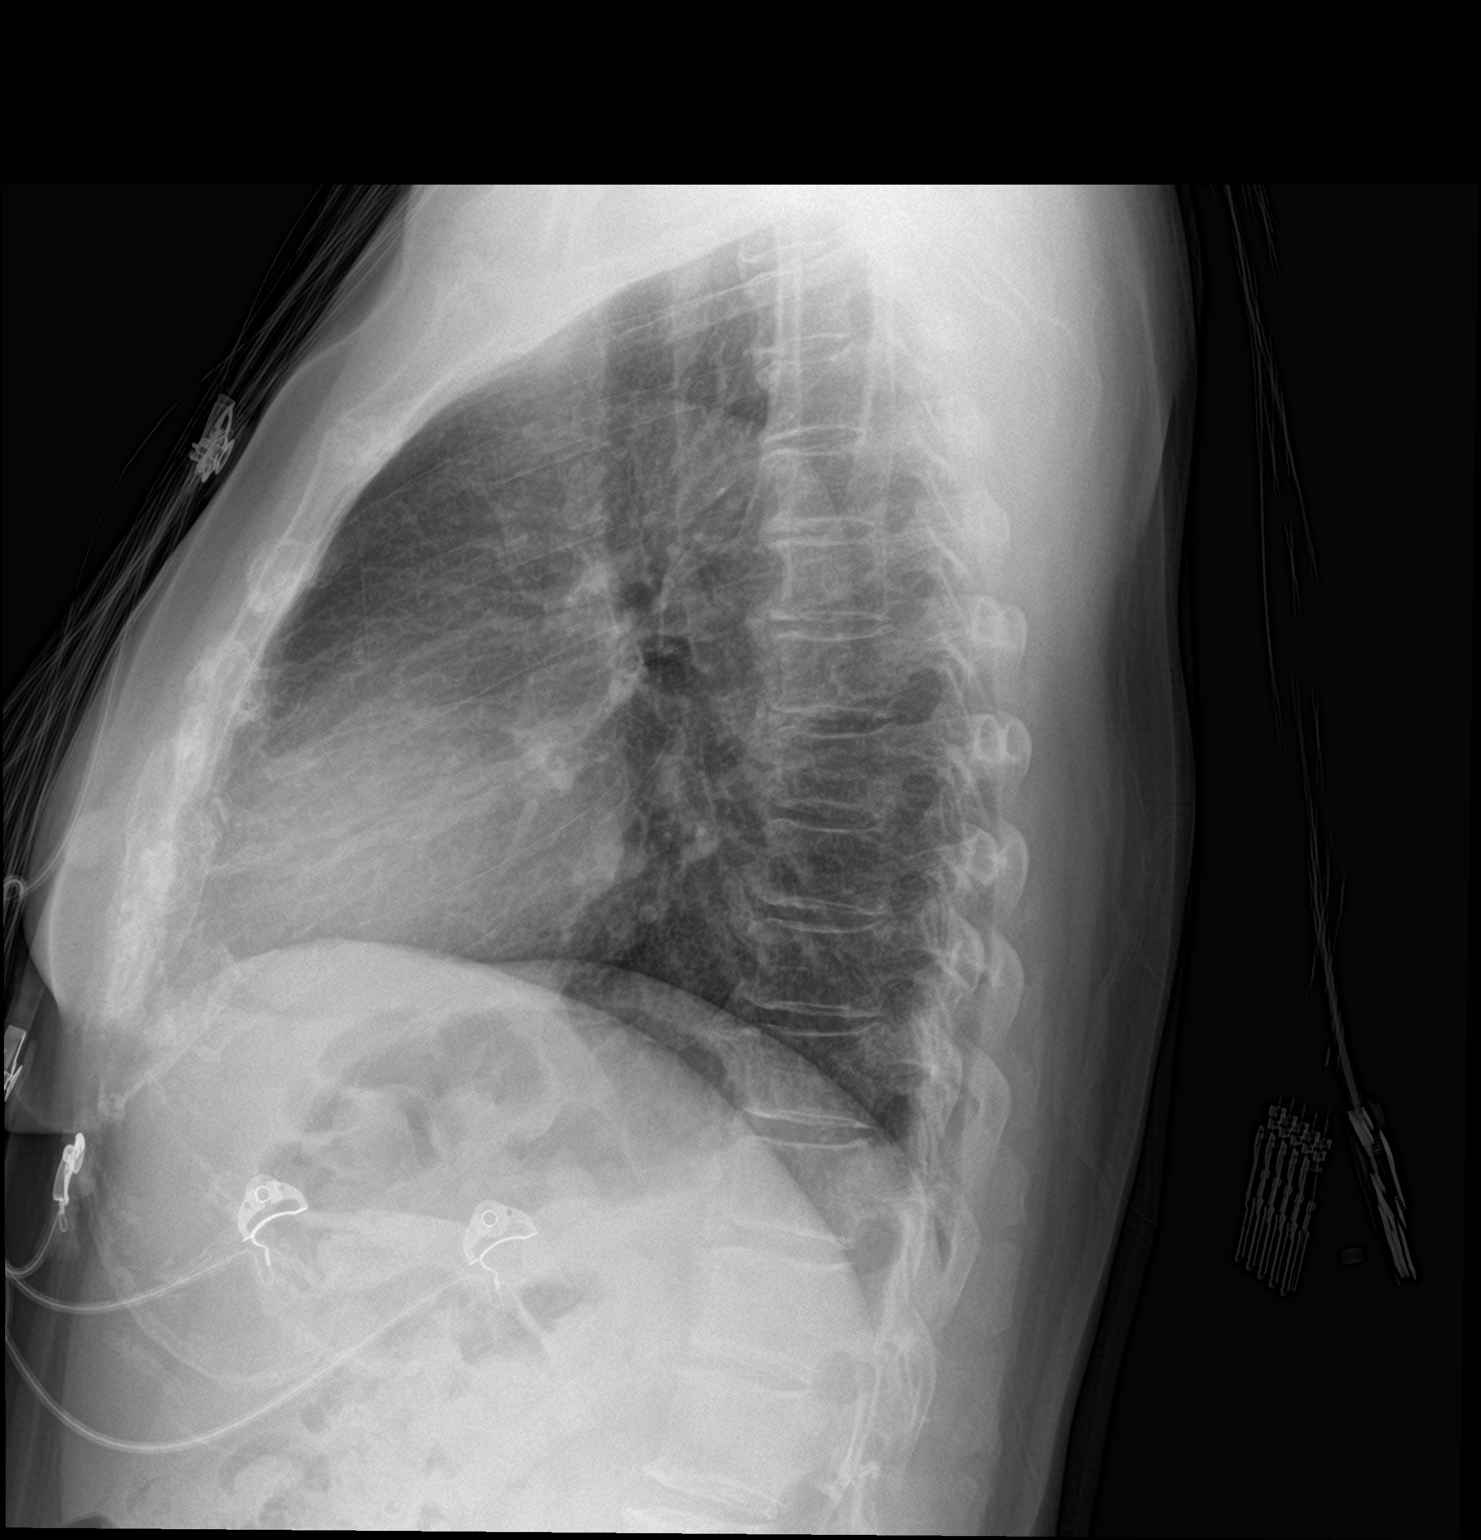

[2 of 2 positions shown; findings below may reference images not displayed]

FINDINGS: The heart size and mediastinal contours are within normal limits.
Both lungs are clear. No pneumothorax or pleural effusion is noted.
The visualized skeletal structures are unremarkable.
IMPRESSION: No active cardiopulmonary disease.

## 2020-01-07 ENCOUNTER — Other Ambulatory Visit: Payer: Self-pay

## 2020-01-07 ENCOUNTER — Ambulatory Visit (INDEPENDENT_AMBULATORY_CARE_PROVIDER_SITE_OTHER): Payer: Medicare Other | Admitting: Family Medicine

## 2020-01-07 ENCOUNTER — Encounter: Payer: Self-pay | Admitting: Family Medicine

## 2020-01-07 VITALS — BP 119/74 | HR 68 | Temp 97.7°F | Ht 61.0 in | Wt 139.2 lb

## 2020-01-07 DIAGNOSIS — M792 Neuralgia and neuritis, unspecified: Secondary | ICD-10-CM | POA: Diagnosis not present

## 2020-01-07 DIAGNOSIS — E785 Hyperlipidemia, unspecified: Secondary | ICD-10-CM | POA: Diagnosis not present

## 2020-01-07 DIAGNOSIS — R7309 Other abnormal glucose: Secondary | ICD-10-CM | POA: Diagnosis not present

## 2020-01-07 DIAGNOSIS — R1013 Epigastric pain: Secondary | ICD-10-CM

## 2020-01-07 DIAGNOSIS — R7303 Prediabetes: Secondary | ICD-10-CM

## 2020-01-07 DIAGNOSIS — M1990 Unspecified osteoarthritis, unspecified site: Secondary | ICD-10-CM

## 2020-01-07 DIAGNOSIS — I10 Essential (primary) hypertension: Secondary | ICD-10-CM

## 2020-01-07 DIAGNOSIS — R21 Rash and other nonspecific skin eruption: Secondary | ICD-10-CM

## 2020-01-07 MED ORDER — GABAPENTIN 300 MG PO CAPS: 300.0000 mg | ORAL_CAPSULE | Freq: Every day | ORAL | 3 refills | Status: DC

## 2020-01-07 MED ORDER — ATORVASTATIN CALCIUM 40 MG PO TABS: 40.0000 mg | ORAL_TABLET | Freq: Every day | ORAL | 3 refills | Status: DC

## 2020-01-07 MED ORDER — VALSARTAN 80 MG PO TABS
80.0000 mg | ORAL_TABLET | Freq: Every day | ORAL | 3 refills | Status: DC
Start: 2020-01-07 — End: 2021-01-12

## 2020-01-07 MED ORDER — METFORMIN HCL ER 750 MG PO TB24: ORAL_TABLET | ORAL | 3 refills | Status: DC

## 2020-01-07 NOTE — Assessment & Plan Note (Signed)
Check A1c.  Continue Metformin 750 mg daily. 

## 2020-01-07 NOTE — Patient Instructions (Signed)
It was very nice to see you today!  We will check blood work today.  Depending on the results may need to get an ultrasound.  You can keep using cortisone cream on the rash.  I will refill your medications today.  Take care, Dr Jimmey Ralph  Please try these tips to maintain a healthy lifestyle:   Eat at least 3 REAL meals and 1-2 snacks per day.  Aim for no more than 5 hours between eating.  If you eat breakfast, please do so within one hour of getting up.    Each meal should contain half fruits/vegetables, one quarter protein, and one quarter carbs (no bigger than a computer mouse)   Cut down on sweet beverages. This includes juice, soda, and sweet tea.     Drink at least 1 glass of water with each meal and aim for at least 8 glasses per day   Exercise at least 150 minutes every week.

## 2020-01-07 NOTE — Assessment & Plan Note (Signed)
Stable with over-the-counter meds. 

## 2020-01-07 NOTE — Progress Notes (Signed)
   Tammy Ramos is a 69 y.o. female who presents today for an office visit.  Assessment/Plan:  New/Acute Problems: Epigastric abdominal pain  History consistent with biliary colic.  PUD also possible though has no heartburn or reflux symptoms will check labs today including CMET and lipase.  Will likely need rectal quadrant ultrasound depending on results.  Rash Nearly resolved with cortisone.  Possibly could have been contact dermatitis.  Also possible mild psoriasis.  She will continue using over-the-counter's as needed.  Chronic Problems Addressed Today: Osteoarthritis Stable with over-the-counter meds.  Neuropathic pain Stable.  Continue gabapentin 300 mg nightly.  Prediabetes Check A1c.  Continue Metformin 750 mg daily.  Essential hypertension At goal.  Continue valsartan 80 mg daily.  Check CBC, CMET, TSH.     Subjective:  HPI:  Patient here for follow-up.  See A/P for status of chronic conditions  She has 2 additional issues she would like to have looked at today  She has had splotchy rash for the last few weeks.  Located on upper extremities.  Has used cortisone cream with significant improvement.  She thinks it may be related to antibiotic she had earlier this year.  Symptoms have nearly resolved  She has also had several months of epigastric pain after eating.  Symptoms last for a couple of hours and then subside.  Located in the upper abdomen.  No associated nausea or vomiting.  No heartburn or reflux.  Happens nearly every time that she eats.       Objective:  Physical Exam: BP 119/74   Pulse 68   Temp 97.7 F (36.5 C) (Temporal)   Ht 5\' 1"  (1.549 m)   Wt 139 lb 3.2 oz (63.1 kg)   SpO2 97%   BMI 26.30 kg/m   Gen: No acute distress, resting comfortably CV: Regular rate and rhythm with no murmurs appreciated Pulm: Normal work of breathing, clear to auscultation bilaterally with no crackles, wheezes, or rhonchi GI: Bowel sounds present.  Soft,  nontender, nondistended.  Murphy sign negative. Neuro: Grossly normal, moves all extremities Psych: Normal affect and thought content      [name] M. , MD 01/07/2020 8:33 AM

## 2020-01-07 NOTE — Assessment & Plan Note (Signed)
At goal.  Continue valsartan 80 mg daily.  Check CBC, CMET, TSH.

## 2020-01-07 NOTE — Assessment & Plan Note (Signed)
Stable.  Continue gabapentin 300 mg nightly.

## 2020-01-08 LAB — CBC
HCT: 40.9 % (ref 35.0–45.0)
Hemoglobin: 14 g/dL (ref 11.7–15.5)
MCH: 30.2 pg (ref 27.0–33.0)
MCHC: 34.2 g/dL (ref 32.0–36.0)
MCV: 88.1 fL (ref 80.0–100.0)
MPV: 9.7 fL (ref 7.5–12.5)
Platelets: 249 10*3/uL (ref 140–400)
RBC: 4.64 10*6/uL (ref 3.80–5.10)
RDW: 12 % (ref 11.0–15.0)
WBC: 5.3 10*3/uL (ref 3.8–10.8)

## 2020-01-08 LAB — COMPREHENSIVE METABOLIC PANEL
AG Ratio: 1.6 (calc) (ref 1.0–2.5)
ALT: 39 U/L — ABNORMAL HIGH (ref 6–29)
AST: 23 U/L (ref 10–35)
Albumin: 4.7 g/dL (ref 3.6–5.1)
Alkaline phosphatase (APISO): 51 U/L (ref 37–153)
BUN: 12 mg/dL (ref 7–25)
CO2: 29 mmol/L (ref 20–32)
Calcium: 10 mg/dL (ref 8.6–10.4)
Chloride: 96 mmol/L — ABNORMAL LOW (ref 98–110)
Creat: 0.8 mg/dL (ref 0.50–0.99)
Globulin: 2.9 g/dL (calc) (ref 1.9–3.7)
Glucose, Bld: 144 mg/dL — ABNORMAL HIGH (ref 65–99)
Potassium: 4.5 mmol/L (ref 3.5–5.3)
Sodium: 135 mmol/L (ref 135–146)
Total Bilirubin: 0.7 mg/dL (ref 0.2–1.2)
Total Protein: 7.6 g/dL (ref 6.1–8.1)

## 2020-01-08 LAB — LIPID PANEL
Cholesterol: 191 mg/dL (ref <200)
HDL: 47 mg/dL — ABNORMAL LOW (ref >=50)
LDL Cholesterol (Calc): 115 mg/dL (calc) — ABNORMAL HIGH
Non-HDL Cholesterol (Calc): 144 mg/dL (calc) — ABNORMAL HIGH (ref <130)
Total CHOL/HDL Ratio: 4.1 (calc) (ref <5.0)
Triglycerides: 176 mg/dL — ABNORMAL HIGH (ref <150)

## 2020-01-08 LAB — LIPASE: Lipase: 43 U/L (ref 7–60)

## 2020-01-08 LAB — TSH: TSH: 1.45 mIU/L (ref 0.40–4.50)

## 2020-01-08 LAB — HEMOGLOBIN A1C
Hgb A1c MFr Bld: 5.9 % of total Hgb — ABNORMAL HIGH (ref <5.7)
Mean Plasma Glucose: 123 mg/dL
eAG (mmol/L): 6.8 mmol/L

## 2020-01-08 NOTE — Progress Notes (Signed)
Please inform patient of the following:  Labs are all stable. Would like to get an ultrasound of her gallbladder like we discussed at her office visit. Please place future order for RUQ ultrasound for biliary colic.  Tammy Ramos. Jimmey Ralph, MD 01/08/2020 9:37 AM

## 2020-01-09 ENCOUNTER — Other Ambulatory Visit: Payer: Self-pay | Admitting: *Deleted

## 2020-01-09 DIAGNOSIS — K8050 Calculus of bile duct without cholangitis or cholecystitis without obstruction: Secondary | ICD-10-CM

## 2020-01-30 ENCOUNTER — Ambulatory Visit
Admission: RE | Admit: 2020-01-30 | Discharge: 2020-01-30 | Disposition: A | Discharge status: Home / Self Care | Payer: Medicare Other | Source: Ambulatory Visit | Attending: Family Medicine | Admitting: Family Medicine

## 2020-01-30 DIAGNOSIS — K7689 Other specified diseases of liver: Secondary | ICD-10-CM | POA: Diagnosis not present

## 2020-01-30 DIAGNOSIS — K8050 Calculus of bile duct without cholangitis or cholecystitis without obstruction: Secondary | ICD-10-CM

## 2020-02-05 ENCOUNTER — Other Ambulatory Visit: Payer: Self-pay | Admitting: *Deleted

## 2020-02-05 DIAGNOSIS — R1013 Epigastric pain: Secondary | ICD-10-CM

## 2020-02-05 NOTE — Progress Notes (Signed)
Please inform patient of the following:  Her ultrasound does not show any gallstones. Recommend referral to GI to make sure she does not have a stomach ulcer. Please place referral.  Tammy Ramos M. Jimmey Ralph, MD 02/05/2020 9:30 AM

## 2020-03-25 ENCOUNTER — Encounter: Payer: Self-pay | Admitting: Gastroenterology

## 2020-03-25 ENCOUNTER — Ambulatory Visit: Payer: Medicare Other | Admitting: Gastroenterology

## 2020-03-25 VITALS — BP 124/90 | HR 64 | Ht 61.0 in | Wt 136.0 lb

## 2020-03-25 DIAGNOSIS — G8929 Other chronic pain: Secondary | ICD-10-CM | POA: Diagnosis not present

## 2020-03-25 DIAGNOSIS — R140 Abdominal distension (gaseous): Secondary | ICD-10-CM | POA: Diagnosis not present

## 2020-03-25 DIAGNOSIS — R1013 Epigastric pain: Secondary | ICD-10-CM | POA: Diagnosis not present

## 2020-03-25 MED ORDER — PANTOPRAZOLE SODIUM 40 MG PO TBEC: 40.0000 mg | DELAYED_RELEASE_TABLET | Freq: Every morning | ORAL | 2 refills | Status: DC

## 2020-03-25 NOTE — Progress Notes (Signed)
Following message sent to Lesly Rubenstein and April Pait:  RADIOLOGY Wilkie Aye  [facility] Phone: [phone] Fax: [phone]   Patient Name: Tammy Ramos DOB: 02-19-1950 MRN #: [id number]  Imaging Ordered: HIDA  Diagnosis: Epigastric pain  Ordering Provider: Dr. Orvan Falconer  Is a Prior Authorization needed? We are in the process of obtaining it now  Is the patient Diabetic? Yes  Does the patient have Hypertension? Yes  Does the patient have any implanted devices or hardware? No  Date of last BUN/Creat, if needed? N/A  Patient Weight? 136#  Is the patient able to get on the table? Yes  Has the patient been diagnosed with COVID? No  Is the patient waiting on COVID testing results? No  Thank you for your assistance! [facility]

## 2020-03-25 NOTE — Progress Notes (Signed)
Referring Provider: Ardith Dark, MD Primary Care Physician:  Ardith Dark, MD  Reason for Consultation:  Epigastric pain   IMPRESSION:  Post-prandial epigastric abdominal pain with associated bloating: Normal liver enzymes, lipase, and RUQ abdominal ultrasound. Suspected symptomatic gallbladder disease however the differential also includes reflux, esophagitis, H pylori, and even gastroparesis. Positive Murphy's sign on exam today.  Screening colonoscopy with Dr. Chales Abrahams 2015: Surveillance due 2025, earlier with lower symptoms.    PLAN: Start pantoprazole 40 mg QAM HIDA with CCK Low threshold for surgical consultation +/- pancreatic imaging EGD with biopsies if HIDA is negative Colonoscopy 2025  I spent 45 minutes, including in depth chart review, independent review of results, face-to-face time with the patient, coordinating care, ordering studies and medications as appropriate, and documentation.   HPI: Tammy Ramos is a 70 y.o. female referred by Dr. Jimmey Ralph for epigastric pain.  The history is obtained through the patient with the assistance of a Madarin interpreter and review of her electronic health record. She has diabetes, elevated cholesterol, hypertension, and a history of hemorrhoidectomy. She has been in the [place] for 15 years.   Several months if not one year of postprandial epigastric abdominal pain and bloating.   Symptoms last for a couple of hours and then subside. Triggered by greasy foods. No associated nausea, vomiting, heartburn, reflux, dysphagia, odynophagia, or change in bowel habits. Wearing a bra is uncomfortable. Pain is controlled by eating yam and corn.  No NSAIDs.  Normal hepatic function panel except for an ALT of 39, normal lipase, normal CBC 01/07/20 RUQ Abdominal ultrasound 01/30/2020 showed an echogenic liver but was otherwise normal. Pancreas was not included.  CT of the abdomen and pelvis with contrast for bloody stools 07/19/2013 showed fatty  liver, renal cysts, and mild cardiomegaly  EGD in 2016 in [place] done for routine examination at that time. She remembers the results being normal. She was not having abdominal pain at that time.   Colonoscopy with Dr. Chales Abrahams for colon cancer screening 08/27/2013 showed benign polypoid colorectal mucosa and benign lymphoid aggregate   No known family history of colon cancer or polyps. No family history of uterine/endometrial cancer, pancreatic cancer or gastric/stomach cancer.   Past Medical History:  Diagnosis Date  . High cholesterol   . Stroke [facility])     No past surgical history on file.  Current Outpatient Medications  Medication Sig Dispense Refill  . aspirin 81 MG chewable tablet Chew by mouth daily.    Marland Kitchen atorvastatin (LIPITOR) 40 MG tablet Take 1 tablet (40 mg total) by mouth at bedtime. 90 tablet 3  . gabapentin (NEURONTIN) 300 MG capsule Take 1 capsule (300 mg total) by mouth at bedtime. 90 capsule 3  . metFORMIN (GLUCOPHAGE-XR) 750 MG 24 hr tablet TAKE 1 TABLET BY MOUTH EVERY DAY WITH BREAKFAST 90 tablet 3  . valsartan (DIOVAN) 80 MG tablet Take 1 tablet (80 mg total) by mouth daily. 90 tablet 3   No current facility-administered medications for this visit.    Allergies as of 03/25/2020  . (No Known Allergies)    Family History  Problem Relation Age of Onset  . Heart attack Neg Hx   . Stroke Neg Hx     Review of Systems: 12 system ROS is negative except as noted above except for insomnia.   Physical Exam: General:   Alert,  well-nourished, pleasant and cooperative in NAD Head:  Normocephalic and atraumatic. Eyes:  Sclera clear, no icterus.   Conjunctiva  pink. Ears:  Normal auditory acuity. Nose:  No deformity, discharge,  or lesions. Mouth:  No deformity or lesions.   Neck:  Supple; no masses or thyromegaly. Lungs:  Clear throughout to auscultation.   No wheezes. Heart:  Regular rate and rhythm; no murmurs. Abdomen:  Soft, mild tenderness in the epgistrium, +  Murphy's sign, nondistended, normal bowel sounds, no rebound or guarding. No hepatosplenomegaly.   Rectal:  Deferred  Msk:  Symmetrical. No boney deformities LAD: No inguinal or umbilical LAD Extremities:  No clubbing or edema. Neurologic:  Alert and  oriented x4;  grossly nonfocal Skin:  Intact without significant lesions or rashes. Psych:  Alert and cooperative. Normal mood and affect.     Kimberly L. Orvan Falconer, MD, MPH 03/25/2020, 10:52 AM

## 2020-03-25 NOTE — Patient Instructions (Addendum)
It was a pleasure to meet you today. Based on our discussion, I am providing you with my recommendations below:  RECOMMENDATION(S):   To better evaluate your symptoms, I am recommending a HIDA scan. In addition, I would also like to recommend a trial of Pantoprazole.  IMAGING:  . You will be contacted by [facility] Scheduling (Your caller ID will indicate phone # [phone]) within the next business 2 days to schedule your HIDA SCAN. If you have not heard from them within 2 business days, please call [facility] Scheduling at [phone] to follow up on the status of your appointment.    PRESCRIPTION MEDICATION(S):   We have sent the following medication(s) to your pharmacy:  . Pantoprazole - please take 40mg  by mouth every morning  NOTE: If your medication(s) requires a PRIOR AUTHORIZATION, we will receive notification from your pharmacy. Once received, the process to submit for approval may take up to 7-10 business days. You will be contacted about any denials we have received from your insurance company as well as alternatives recommended by your provider.  BMI:  . If you are age 70 or older, your body mass index should be between 23-30. Your There is no height or weight on file to calculate BMI. If this is out of the aforementioned range listed, please consider follow up with your Primary Care Provider.  Thank you for trusting me with your gastrointestinal care!    11-08-1991, MD, MPH

## 2020-03-31 NOTE — Progress Notes (Signed)
Appointment Information  Name: Hatsuko, Bizzarro MRN: [id number]  Date: 04/17/2020 Status: Sch  Time: 10:00 AM Length: 180  Visit Type: NM HEPATO W/ EF [[id number]] Copay: $0.00  Provider: [facility] Department: [facility] MEDICINE  Referring Provider: Tressia Danas CSN: [id number]  Notes: s/w pt Husband, npo mid, arrv 30 min early  Made On: Change Notes: 03/31/2020 8:54 AM 03/31/2020 8:55 AM By: By: Josephine Igo, CARRIELELIA E

## 2020-04-17 ENCOUNTER — Ambulatory Visit (HOSPITAL_COMMUNITY)
Admission: RE | Admit: 2020-04-17 | Discharge: 2020-04-17 | Disposition: A | Discharge status: Home / Self Care | Payer: Medicare Other | Source: Ambulatory Visit | Attending: Gastroenterology | Admitting: Gastroenterology

## 2020-04-17 ENCOUNTER — Other Ambulatory Visit: Payer: Self-pay

## 2020-04-17 DIAGNOSIS — R1013 Epigastric pain: Secondary | ICD-10-CM | POA: Diagnosis not present

## 2020-04-17 DIAGNOSIS — G8929 Other chronic pain: Secondary | ICD-10-CM

## 2020-04-17 DIAGNOSIS — R140 Abdominal distension (gaseous): Secondary | ICD-10-CM | POA: Diagnosis not present

## 2020-04-17 DIAGNOSIS — K829 Disease of gallbladder, unspecified: Secondary | ICD-10-CM | POA: Diagnosis not present

## 2020-04-17 MED ORDER — TECHNETIUM TC 99M MEBROFENIN IV KIT
5.4000 | PACK | Freq: Once | INTRAVENOUS | Status: AC | PRN
  Administered 2020-04-17: 5.4 via INTRAVENOUS

## 2020-04-28 ENCOUNTER — Telehealth: Payer: Self-pay

## 2020-04-28 NOTE — Telephone Encounter (Signed)
Thank you for the update!

## 2020-04-28 NOTE — Telephone Encounter (Signed)
Noted  

## 2020-04-28 NOTE — Telephone Encounter (Signed)
Patient has an appointment with Dr. Magnus Ivan in 05-27-20 at 3:50pm

## 2020-05-27 ENCOUNTER — Other Ambulatory Visit: Payer: Self-pay | Admitting: Surgery

## 2020-05-27 DIAGNOSIS — K828 Other specified diseases of gallbladder: Secondary | ICD-10-CM | POA: Diagnosis not present

## 2020-06-28 ENCOUNTER — Other Ambulatory Visit: Payer: Self-pay | Admitting: Gastroenterology

## 2020-06-28 DIAGNOSIS — R140 Abdominal distension (gaseous): Secondary | ICD-10-CM

## 2020-06-28 DIAGNOSIS — G8929 Other chronic pain: Secondary | ICD-10-CM

## 2021-01-05 ENCOUNTER — Other Ambulatory Visit: Payer: Self-pay

## 2021-01-05 ENCOUNTER — Encounter: Payer: Self-pay | Admitting: Family Medicine

## 2021-01-05 ENCOUNTER — Ambulatory Visit (INDEPENDENT_AMBULATORY_CARE_PROVIDER_SITE_OTHER): Payer: Medicare Other | Admitting: Family Medicine

## 2021-01-05 VITALS — BP 153/88 | HR 73 | Temp 98.1°F | Ht 61.0 in | Wt 138.2 lb

## 2021-01-05 DIAGNOSIS — Z23 Encounter for immunization: Secondary | ICD-10-CM | POA: Diagnosis not present

## 2021-01-05 DIAGNOSIS — I10 Essential (primary) hypertension: Secondary | ICD-10-CM

## 2021-01-05 DIAGNOSIS — Z0001 Encounter for general adult medical examination with abnormal findings: Secondary | ICD-10-CM | POA: Diagnosis not present

## 2021-01-05 DIAGNOSIS — E785 Hyperlipidemia, unspecified: Secondary | ICD-10-CM | POA: Diagnosis not present

## 2021-01-05 DIAGNOSIS — E2839 Other primary ovarian failure: Secondary | ICD-10-CM

## 2021-01-05 DIAGNOSIS — R7303 Prediabetes: Secondary | ICD-10-CM

## 2021-01-05 NOTE — Patient Instructions (Signed)
It was very nice to see you today!  We will give your pneumonia vaccine today.  Please try taking Benadryl for the irritation in your throat.  We will check blood work.  Please schedule your bone density and mammogram soon.  Please call the number to set up an appointment.  Keep an eye on your blood pressure and let me know if it is persistently 150/90 or higher.  We will see you back in 1 year for your next physical.  Come back sooner if needed.  Take care, Dr Jerline Pain  PLEASE NOTE:  If you had any lab tests please let us know if you have not heard back within a few days. You may see your results on mychart before we have a chance to review them but we will give you a call once they are reviewed by [place]. If we ordered any referrals today, please let us know if you have not heard from their office within the next week.   Please try these tips to maintain a healthy lifestyle:  Eat at least 3 REAL meals and 1-2 snacks per day.  Aim for no more than 5 hours between eating.  If you eat breakfast, please do so within one hour of getting up.   Each meal should contain half fruits/vegetables, one quarter protein, and one quarter carbs (no bigger than a computer mouse)  Cut down on sweet beverages. This includes juice, soda, and sweet tea.   Drink at least 1 glass of water with each meal and aim for at least 8 glasses per day  Exercise at least 150 minutes every week.    Preventive Care 44 Years and Older, Female Preventive care refers to lifestyle choices and visits with your health care provider that can promote health and wellness. Preventive care visits are also called wellness exams. What can I expect for my preventive care visit? Counseling Your health care provider may ask you questions about your: Medical history, including: Past medical problems. Family medical history. Pregnancy and menstrual history. History of falls. Current health, including: Memory and ability to  understand (cognition). Emotional well-being. Home life and relationship well-being. Sexual activity and sexual health. Lifestyle, including: Alcohol, nicotine or tobacco, and drug use. Access to firearms. Diet, exercise, and sleep habits. Work and work Statistician. Sunscreen use. Safety issues such as seatbelt and bike helmet use. Physical exam Your health care provider will check your: Height and weight. These may be used to calculate your BMI (body mass index). BMI is a measurement that tells if you are at a healthy weight. Waist circumference. This measures the distance around your waistline. This measurement also tells if you are at a healthy weight and may help predict your risk of certain diseases, such as type 2 diabetes and high blood pressure. Heart rate and blood pressure. Body temperature. Skin for abnormal spots. What immunizations do I need? Vaccines are usually given at various ages, according to a schedule. Your health care provider will recommend vaccines for you based on your age, medical history, and lifestyle or other factors, such as travel or where you work. What tests do I need? Screening Your health care provider may recommend screening tests for certain conditions. This may include: Lipid and cholesterol levels. Hepatitis C test. Hepatitis B test. HIV (human immunodeficiency virus) test. STI (sexually transmitted infection) testing, if you are at risk. Lung cancer screening. Colorectal cancer screening. Diabetes screening. This is done by checking your blood sugar (glucose) after you have not  eaten for a while (fasting). Mammogram. Talk with your health care provider about how often you should have regular mammograms. BRCA-related cancer screening. This may be done if you have a family history of breast, ovarian, tubal, or peritoneal cancers. Bone density scan. This is done to screen for osteoporosis. Talk with your health care provider about your test  results, treatment options, and if necessary, the need for more tests. Follow these instructions at home: Eating and drinking  Eat a diet that includes fresh fruits and vegetables, whole grains, lean protein, and low-fat dairy products. Limit your intake of foods with high amounts of sugar, saturated fats, and salt. Take vitamin and mineral supplements as recommended by your health care provider. Do not drink alcohol if your health care provider tells you not to drink. If you drink alcohol: Limit how much you have to 0-1 drink a day. Know how much alcohol is in your drink. In the [place] one drink equals one 12 oz bottle of beer (355 mL), one 5 oz glass of wine (148 mL), or one 1 oz glass of hard liquor (44 mL). Lifestyle Brush your teeth every morning and night with fluoride toothpaste. Floss one time each day. Exercise for at least 30 minutes 5 or more days each week. Do not use any products that contain nicotine or tobacco. These products include cigarettes, chewing tobacco, and vaping devices, such as e-cigarettes. If you need help quitting, ask your health care provider. Do not use drugs. If you are sexually active, practice safe sex. Use a condom or other form of protection in order to prevent STIs. Take aspirin only as told by your health care provider. Make sure that you understand how much to take and what form to take. Work with your health care provider to find out whether it is safe and beneficial for you to take aspirin daily. Ask your health care provider if you need to take a cholesterol-lowering medicine (statin). Find healthy ways to manage stress, such as: Meditation, yoga, or listening to music. Journaling. Talking to a trusted person. Spending time with friends and family. Minimize exposure to UV radiation to reduce your risk of skin cancer. Safety Always wear your seat belt while driving or riding in a vehicle. Do not drive: If you have been drinking alcohol. Do not  ride with someone who has been drinking. When you are tired or distracted. While texting. If you have been using any mind-altering substances or drugs. Wear a helmet and other protective equipment during sports activities. If you have firearms in your house, make sure you follow all gun safety procedures. What's next? Visit your health care provider once a year for an annual wellness visit. Ask your health care provider how often you should have your eyes and teeth checked. Stay up to date on all vaccines. This information is not intended to replace advice given to you by your health care provider. Make sure you discuss any questions you have with your health care provider. Document Revised: 07/16/2020 Document Reviewed: 07/16/2020 Elsevier Patient Education  [place].

## 2021-01-05 NOTE — Addendum Note (Signed)
Addended by: Dyann Kief on: 01/05/2021 03:47 PM   Modules accepted: Orders

## 2021-01-05 NOTE — Assessment & Plan Note (Addendum)
Slightly above goal though typically well controlled.  Continue valsartan 80 mg daily.  Continue home monitoring goal 150/90 or lower.  Check labs today.

## 2021-01-05 NOTE — Assessment & Plan Note (Signed)
On metformin 750 mg daily.  Check A1c.

## 2021-01-05 NOTE — Progress Notes (Signed)
Chief Complaint:  Tammy Ramos is a 70 y.o. female who presents today for her annual comprehensive physical exam.    Assessment/Plan:  New/Acute Problems: Throat Irritation No red flags.  May have mild postnasal drip.  Given that symptoms have all been present for a day or so we will continue with watchful waiting.  She can use over-the-counter Benadryl as needed.  Chronic Problems Addressed Today: Prediabetes On metformin 750 mg daily.  Check A1c.  Essential hypertension Slightly above goal though typically well controlled.  Continue valsartan 80 mg daily.  Continue home monitoring goal 150/90 or lower.  Check labs today.  Preventative Healthcare: Prevnar 20 given today.  Check labs.  We will order DEXA and mammogram.  Up-to-date on colon cancer screening.  Patient Counseling(The following topics were reviewed and/or handout was given):  -Nutrition: Stressed importance of moderation in sodium/caffeine intake, saturated fat and cholesterol, caloric balance, sufficient intake of fresh fruits, vegetables, and fiber.  -Stressed the importance of regular exercise.   -Substance Abuse: Discussed cessation/primary prevention of tobacco, alcohol, or other drug use; driving or other dangerous activities under the influence; availability of treatment for abuse.   -Injury prevention: Discussed safety belts, safety helmets, smoke detector, smoking near bedding or upholstery.   -Sexuality: Discussed sexually transmitted diseases, partner selection, use of condoms, avoidance of unintended pregnancy and contraceptive alternatives.   -Dental health: Discussed importance of regular tooth brushing, flossing, and dental visits.  -Health maintenance and immunizations reviewed. Please refer to Health maintenance section.  Return to care in 1 year for next preventative visit.     Subjective:  HPI:  She has no acute complaints today.   Started having itching in her throat yesterday. Some cough. No  treatments tried. No rhinorrhea. No congestion. No fevers or chills. Symptoms worsening over the last day. No problem with eating or drinking.   Lifestyle Diet: Balanced.  Exercise: Llikes to garden.   Depression screen PHQ 2/9 01/05/2021  Decreased Interest 0  Down, Depressed, Hopeless 0  PHQ - 2 Score 0  Altered sleeping -  Tired, decreased energy -  Change in appetite -  Feeling bad or failure about yourself  -  Trouble concentrating -  Moving slowly or fidgety/restless -  Suicidal thoughts -  PHQ-9 Score -  Difficult doing work/chores -    Health Maintenance Due  Topic Date Due   Hepatitis C Screening  Never done   TETANUS/TDAP  Never done   MAMMOGRAM  Never done   Pneumonia Vaccine 31+ Years old (1 - PCV) Never done   DEXA SCAN  Never done   COVID-19 Vaccine (4 - Booster for Moderna series) 02/13/2020   Zoster Vaccines- Shingrix (2 of 2) 06/25/2020     ROS: Per HPI, otherwise a complete review of systems was negative.   PMH:  The following were reviewed and entered/updated in epic: Past Medical History:  Diagnosis Date   High cholesterol    Stroke [facility])    Patient Active Problem List   Diagnosis Date Noted   Osteoarthritis 01/09/2019   Prediabetes 10/12/2018   Neuropathic pain 10/12/2018   History of stroke 10/02/2018   Essential hypertension 10/02/2018   Past Surgical History:  Procedure Laterality Date   HEMORRHOID SURGERY      Family History  Problem Relation Age of Onset   Heart attack Neg Hx    Stroke Neg Hx     Medications- reviewed and updated Current Outpatient Medications  Medication Sig Dispense Refill  aspirin 81 MG chewable tablet Chew by mouth daily.     atorvastatin (LIPITOR) 40 MG tablet Take 1 tablet (40 mg total) by mouth at bedtime. 90 tablet 3   metFORMIN (GLUCOPHAGE-XR) 750 MG 24 hr tablet TAKE 1 TABLET BY MOUTH EVERY DAY WITH BREAKFAST 90 tablet 3   valsartan (DIOVAN) 80 MG tablet Take 1 tablet (80 mg total) by mouth daily.  90 tablet 3   No current facility-administered medications for this visit.    Allergies-reviewed and updated No Known Allergies  Social History   Socioeconomic History   Marital status: Married    Spouse name: Not on file   Number of children: 1   Years of education: Not on file   Highest education level: Not on file  Occupational History   Not on file  Tobacco Use   Smoking status: Never   Smokeless tobacco: Never  Substance and Sexual Activity   Alcohol use: Never   Drug use: Never   Sexual activity: Not on file  Other Topics Concern   Not on file  Social History Narrative   Not on file   Social Determinants of Health   Financial Resource Strain: Not on file  Food Insecurity: Not on file  Transportation Needs: Not on file  Physical Activity: Not on file  Stress: Not on file  Social Connections: Not on file        Objective:  Physical Exam: BP (!) 153/88   Pulse 78   Temp 98.1 F (36.7 C) (Temporal)   Ht 5\' 1"  (1.549 m)   Wt 138 lb 3.2 oz (62.7 kg)   SpO2 97%   BMI 26.11 kg/m   Body mass index is 26.11 kg/m. Wt Readings from Last 3 Encounters:  01/05/21 138 lb 3.2 oz (62.7 kg)  03/25/20 136 lb (61.7 kg)  01/07/20 139 lb 3.2 oz (63.1 kg)  Gen: NAD, resting comfortably HEENT: TMs normal bilaterally. OP clear. No thyromegaly noted.  CV: RRR with no murmurs appreciated Pulm: NWOB, CTAB with no crackles, wheezes, or rhonchi GI: Normal bowel sounds present. Soft, Nontender, Nondistended. MSK: no edema, cyanosis, or clubbing noted Skin: warm, dry Neuro: CN2-12 grossly intact. Strength 5/5 in upper and lower extremities. Reflexes symmetric and intact bilaterally.  Psych: Normal affect and thought content     Romi Rathel M. 14/06/21, MD 01/05/2021 3:39 PM

## 2021-01-06 ENCOUNTER — Other Ambulatory Visit: Payer: Self-pay | Admitting: Family Medicine

## 2021-01-06 DIAGNOSIS — Z1231 Encounter for screening mammogram for malignant neoplasm of breast: Secondary | ICD-10-CM

## 2021-01-06 LAB — COMPREHENSIVE METABOLIC PANEL
ALT: 34 U/L (ref 0–35)
AST: 23 U/L (ref 0–37)
Albumin: 5 g/dL (ref 3.5–5.2)
Alkaline Phosphatase: 51 U/L (ref 39–117)
BUN: 16 mg/dL (ref 6–23)
CO2: 28 mEq/L (ref 19–32)
Calcium: 10.5 mg/dL (ref 8.4–10.5)
Chloride: 98 mEq/L (ref 96–112)
Creatinine, Ser: 0.8 mg/dL (ref 0.40–1.20)
GFR: 75.88 mL/min (ref >60.00)
Glucose, Bld: 87 mg/dL (ref 70–99)
Potassium: 4.6 mEq/L (ref 3.5–5.1)
Sodium: 134 mEq/L — ABNORMAL LOW (ref 135–145)
Total Bilirubin: 0.6 mg/dL (ref 0.2–1.2)
Total Protein: 8.1 g/dL (ref 6.0–8.3)

## 2021-01-06 LAB — LIPID PANEL
Cholesterol: 192 mg/dL (ref 0–200)
HDL: 48.3 mg/dL (ref >39.00)
NonHDL: 143.72
Total CHOL/HDL Ratio: 4
Triglycerides: 204 mg/dL — ABNORMAL HIGH (ref 0.0–149.0)
VLDL: 40.8 mg/dL — ABNORMAL HIGH (ref 0.0–40.0)

## 2021-01-06 LAB — TSH: TSH: 0.9 u[IU]/mL (ref 0.35–5.50)

## 2021-01-06 LAB — LDL CHOLESTEROL, DIRECT: Direct LDL: 135 mg/dL

## 2021-01-06 LAB — CBC
HCT: 39.4 % (ref 36.0–46.0)
Hemoglobin: 13.4 g/dL (ref 12.0–15.0)
MCHC: 34 g/dL (ref 30.0–36.0)
MCV: 87.8 fl (ref 78.0–100.0)
Platelets: 225 10*3/uL (ref 150.0–400.0)
RBC: 4.49 Mil/uL (ref 3.87–5.11)
RDW: 13.1 % (ref 11.5–15.5)
WBC: 6.4 10*3/uL (ref 4.0–10.5)

## 2021-01-06 LAB — HEMOGLOBIN A1C: Hgb A1c MFr Bld: 6 % (ref 4.6–6.5)

## 2021-01-06 NOTE — Progress Notes (Signed)
Please inform patient of the following:  Good news! Her labs are all stable. Do not need to make any changes to her treatment plan at this time. Would like for her to keep up the good work and we can recheck in a year or so.  Tammy Ramos. Jimmey Ralph, MD 01/06/2021 2:27 PM

## 2021-01-10 ENCOUNTER — Other Ambulatory Visit: Payer: Self-pay | Admitting: Family Medicine

## 2021-01-19 ENCOUNTER — Other Ambulatory Visit: Payer: Self-pay | Admitting: Family Medicine

## 2021-01-19 ENCOUNTER — Telehealth: Payer: Self-pay

## 2021-01-19 NOTE — Telephone Encounter (Signed)
MEDICATION: atorvastatin (LIPITOR) 40 MG tablet  PHARMACY: [id number] [place], [place] - [address] [facility] [address] [facility] Phone:  [phone]  Fax:  [phone]      Comments: Patient states the pharmacy hasn't received it   **Let patient know to contact pharmacy at the end of the day to make sure medication is ready. **  ** Please notify patient to allow 48-72 hours to process**  **Encourage patient to contact the pharmacy for refills or they can request refills through [facility]**

## 2021-01-20 MED ORDER — ATORVASTATIN CALCIUM 40 MG PO TABS: ORAL_TABLET | ORAL | 3 refills | Status: DC

## 2021-01-20 NOTE — Telephone Encounter (Signed)
Rx sent to pharmacy   

## 2021-02-06 ENCOUNTER — Ambulatory Visit
Admission: RE | Admit: 2021-02-06 | Discharge: 2021-02-06 | Disposition: A | Discharge status: Home / Self Care | Payer: Medicare Other | Source: Ambulatory Visit | Attending: Family Medicine | Admitting: Family Medicine

## 2021-02-06 ENCOUNTER — Other Ambulatory Visit: Payer: Self-pay

## 2021-02-06 DIAGNOSIS — Z1231 Encounter for screening mammogram for malignant neoplasm of breast: Secondary | ICD-10-CM | POA: Diagnosis not present

## 2021-02-11 ENCOUNTER — Other Ambulatory Visit: Payer: Self-pay | Admitting: Family Medicine

## 2021-02-11 DIAGNOSIS — R928 Other abnormal and inconclusive findings on diagnostic imaging of breast: Secondary | ICD-10-CM

## 2021-03-05 ENCOUNTER — Ambulatory Visit
Admission: RE | Admit: 2021-03-05 | Discharge: 2021-03-05 | Disposition: A | Discharge status: Home / Self Care | Payer: Medicare Other | Source: Ambulatory Visit | Attending: Family Medicine | Admitting: Family Medicine

## 2021-03-05 DIAGNOSIS — Z780 Asymptomatic menopausal state: Secondary | ICD-10-CM | POA: Diagnosis not present

## 2021-03-05 DIAGNOSIS — Z0001 Encounter for general adult medical examination with abnormal findings: Secondary | ICD-10-CM

## 2021-03-05 DIAGNOSIS — E2839 Other primary ovarian failure: Secondary | ICD-10-CM

## 2021-03-05 DIAGNOSIS — M85851 Other specified disorders of bone density and structure, right thigh: Secondary | ICD-10-CM | POA: Diagnosis not present

## 2021-03-10 NOTE — Progress Notes (Signed)
Please inform patient of the following:  Her bone density scan shows mild thinning of the bone called osteopenia. We do not need to start any medications but she should be taking a calcium and vitamin D supplement and exercising daily.  We can repeat in 2 years.

## 2021-03-17 ENCOUNTER — Ambulatory Visit
Admission: RE | Admit: 2021-03-17 | Discharge: 2021-03-17 | Disposition: A | Discharge status: Home / Self Care | Payer: Medicare Other | Source: Ambulatory Visit | Attending: Family Medicine | Admitting: Family Medicine

## 2021-03-17 ENCOUNTER — Other Ambulatory Visit: Payer: Self-pay | Admitting: Family Medicine

## 2021-03-17 DIAGNOSIS — R921 Mammographic calcification found on diagnostic imaging of breast: Secondary | ICD-10-CM

## 2021-03-17 DIAGNOSIS — R928 Other abnormal and inconclusive findings on diagnostic imaging of breast: Secondary | ICD-10-CM

## 2021-03-17 DIAGNOSIS — R922 Inconclusive mammogram: Secondary | ICD-10-CM | POA: Diagnosis not present

## 2021-03-17 DIAGNOSIS — N6310 Unspecified lump in the right breast, unspecified quadrant: Secondary | ICD-10-CM

## 2021-03-25 ENCOUNTER — Other Ambulatory Visit: Payer: Self-pay | Admitting: Family Medicine

## 2021-04-08 DIAGNOSIS — H40033 Anatomical narrow angle, bilateral: Secondary | ICD-10-CM | POA: Diagnosis not present

## 2021-04-08 DIAGNOSIS — E119 Type 2 diabetes mellitus without complications: Secondary | ICD-10-CM | POA: Diagnosis not present

## 2021-05-11 ENCOUNTER — Other Ambulatory Visit: Payer: Self-pay | Admitting: Gastroenterology

## 2021-05-11 DIAGNOSIS — G8929 Other chronic pain: Secondary | ICD-10-CM

## 2021-05-11 DIAGNOSIS — R140 Abdominal distension (gaseous): Secondary | ICD-10-CM

## 2021-05-11 NOTE — Telephone Encounter (Signed)
Patient needs office visit.  

## 2021-09-16 ENCOUNTER — Ambulatory Visit
Admission: RE | Admit: 2021-09-16 | Discharge: 2021-09-16 | Disposition: A | Discharge status: Home / Self Care | Payer: Medicare Other | Source: Ambulatory Visit | Attending: Family Medicine | Admitting: Family Medicine

## 2021-09-16 ENCOUNTER — Ambulatory Visit: Admission: RE | Admit: 2021-09-16 | Payer: Medicare Other | Source: Ambulatory Visit

## 2021-09-16 ENCOUNTER — Other Ambulatory Visit: Payer: Self-pay | Admitting: Family Medicine

## 2021-09-16 DIAGNOSIS — R921 Mammographic calcification found on diagnostic imaging of breast: Secondary | ICD-10-CM

## 2021-09-16 DIAGNOSIS — N6310 Unspecified lump in the right breast, unspecified quadrant: Secondary | ICD-10-CM

## 2021-09-16 DIAGNOSIS — R928 Other abnormal and inconclusive findings on diagnostic imaging of breast: Secondary | ICD-10-CM

## 2021-09-19 ENCOUNTER — Other Ambulatory Visit: Payer: Self-pay | Admitting: Gastroenterology

## 2021-09-19 DIAGNOSIS — G8929 Other chronic pain: Secondary | ICD-10-CM

## 2021-09-19 DIAGNOSIS — R140 Abdominal distension (gaseous): Secondary | ICD-10-CM

## 2021-10-26 ENCOUNTER — Encounter: Payer: Self-pay | Admitting: *Deleted

## 2021-11-14 ENCOUNTER — Other Ambulatory Visit: Payer: Self-pay | Admitting: Family Medicine

## 2022-01-07 ENCOUNTER — Ambulatory Visit (INDEPENDENT_AMBULATORY_CARE_PROVIDER_SITE_OTHER): Payer: Medicare Other | Admitting: Family Medicine

## 2022-01-07 ENCOUNTER — Encounter: Payer: Self-pay | Admitting: Family Medicine

## 2022-01-07 VITALS — BP 164/93 | HR 66 | Temp 97.3°F | Ht 61.0 in | Wt 137.0 lb

## 2022-01-07 DIAGNOSIS — J309 Allergic rhinitis, unspecified: Secondary | ICD-10-CM | POA: Diagnosis not present

## 2022-01-07 DIAGNOSIS — E785 Hyperlipidemia, unspecified: Secondary | ICD-10-CM

## 2022-01-07 DIAGNOSIS — Z0001 Encounter for general adult medical examination with abnormal findings: Secondary | ICD-10-CM | POA: Diagnosis not present

## 2022-01-07 DIAGNOSIS — R7303 Prediabetes: Secondary | ICD-10-CM

## 2022-01-07 DIAGNOSIS — R911 Solitary pulmonary nodule: Secondary | ICD-10-CM

## 2022-01-07 DIAGNOSIS — I10 Essential (primary) hypertension: Secondary | ICD-10-CM

## 2022-01-07 DIAGNOSIS — F39 Unspecified mood [affective] disorder: Secondary | ICD-10-CM

## 2022-01-07 DIAGNOSIS — Z13220 Encounter for screening for lipoid disorders: Secondary | ICD-10-CM

## 2022-01-07 DIAGNOSIS — Z1159 Encounter for screening for other viral diseases: Secondary | ICD-10-CM

## 2022-01-07 LAB — COMPREHENSIVE METABOLIC PANEL
ALT: 23 U/L (ref 0–35)
AST: 19 U/L (ref 0–37)
Albumin: 5.1 g/dL (ref 3.5–5.2)
Alkaline Phosphatase: 59 U/L (ref 39–117)
BUN: 12 mg/dL (ref 6–23)
CO2: 31 mEq/L (ref 19–32)
Calcium: 10.1 mg/dL (ref 8.4–10.5)
Chloride: 93 mEq/L — ABNORMAL LOW (ref 96–112)
Creatinine, Ser: 0.83 mg/dL (ref 0.40–1.20)
GFR: 72.1 mL/min (ref >60.00)
Glucose, Bld: 105 mg/dL — ABNORMAL HIGH (ref 70–99)
Potassium: 4.5 mEq/L (ref 3.5–5.1)
Sodium: 134 mEq/L — ABNORMAL LOW (ref 135–145)
Total Bilirubin: 0.5 mg/dL (ref 0.2–1.2)
Total Protein: 8.3 g/dL (ref 6.0–8.3)

## 2022-01-07 LAB — CBC
HCT: 44 % (ref 36.0–46.0)
Hemoglobin: 15.1 g/dL — ABNORMAL HIGH (ref 12.0–15.0)
MCHC: 34.3 g/dL (ref 30.0–36.0)
MCV: 86.9 fl (ref 78.0–100.0)
Platelets: 280 10*3/uL (ref 150.0–400.0)
RBC: 5.06 Mil/uL (ref 3.87–5.11)
RDW: 12.6 % (ref 11.5–15.5)
WBC: 5.2 10*3/uL (ref 4.0–10.5)

## 2022-01-07 LAB — HEMOGLOBIN A1C: Hgb A1c MFr Bld: 6.4 % (ref 4.6–6.5)

## 2022-01-07 LAB — LIPID PANEL
Cholesterol: 395 mg/dL — ABNORMAL HIGH (ref 0–200)
HDL: 53.9 mg/dL (ref >39.00)
Total CHOL/HDL Ratio: 7
Triglycerides: 435 mg/dL — ABNORMAL HIGH (ref 0.0–149.0)

## 2022-01-07 LAB — TSH: TSH: 0.91 u[IU]/mL (ref 0.35–5.50)

## 2022-01-07 LAB — LDL CHOLESTEROL, DIRECT: Direct LDL: 275 mg/dL

## 2022-01-07 MED ORDER — AMLODIPINE BESYLATE 10 MG PO TABS: 10.0000 mg | ORAL_TABLET | Freq: Every day | ORAL | 3 refills | Status: DC

## 2022-01-07 NOTE — Assessment & Plan Note (Addendum)
Discussed lifestyle modifications.  She is doing well with metformin 750 mg daily.  Check A1c.

## 2022-01-07 NOTE — Assessment & Plan Note (Signed)
No prior records available for review.  This was diagnosed while she was in [place] several months ago.  We will obtain low-dose chest CT contrast for follow-up.

## 2022-01-07 NOTE — Assessment & Plan Note (Signed)
Likely the source of her throat irritation.  No red flag signs or symptoms.  Reassuring exam today.  She will try over-the-counter Claritin or Zyrtec.  She will let me know if not proving and we can refer to ENT.

## 2022-01-07 NOTE — Assessment & Plan Note (Signed)
Blood pressure above goal today.  She has not been able to tolerate higher doses of valsartan due to dizziness.  We will switch to amlodipine 10 mg daily.  Check labs today.  She will come back in a couple of weeks to recheck blood pressure.

## 2022-01-07 NOTE — Progress Notes (Signed)
Chief Complaint:  Tammy Ramos is a 71 y.o. female who presents today for her annual comprehensive physical exam.    Assessment/Plan:  Chronic Problems Addressed Today: Essential hypertension Blood pressure above goal today.  She has not been able to tolerate higher doses of valsartan due to dizziness.  We will switch to amlodipine 10 mg daily.  Check labs today.  She will come back in a couple of weeks to recheck blood pressure.  Prediabetes Discussed lifestyle modifications.  She is doing well with metformin 750 mg daily.  Check A1c.  Allergic rhinitis Likely the source of her throat irritation.  No red flag signs or symptoms.  Reassuring exam today.  She will try over-the-counter Claritin or Zyrtec.  She will let me know if not proving and we can refer to ENT.  Lung nodule No prior records available for review.  This was diagnosed while she was in [place] several months ago.  We will obtain low-dose chest CT contrast for follow-up.  Dyslipidemia She is on Lipitor 40 mg daily.  Tolerating well.  Will check lipids today.  Preventative Healthcare: Needs repeat breast cancer screening in a few months -this has been ordered.  She will let me know if they do not call to schedule this.  She is up-to-date on all of her vaccines.  Up-to-date on colon cancer screening.  Patient Counseling(The following topics were reviewed and/or handout was given):  -Nutrition: Stressed importance of moderation in sodium/caffeine intake, saturated fat and cholesterol, caloric balance, sufficient intake of fresh fruits, vegetables, and fiber.  -Stressed the importance of regular exercise.   -Substance Abuse: Discussed cessation/primary prevention of tobacco, alcohol, or other drug use; driving or other dangerous activities under the influence; availability of treatment for abuse.   -Injury prevention: Discussed safety belts, safety helmets, smoke detector, smoking near bedding or upholstery.   -Sexuality:  Discussed sexually transmitted diseases, partner selection, use of condoms, avoidance of unintended pregnancy and contraceptive alternatives.   -Dental health: Discussed importance of regular tooth brushing, flossing, and dental visits.  -Health maintenance and immunizations reviewed. Please refer to Health maintenance section.  Return to care in 1 year for next preventative visit.     Subjective:  HPI:  She has no acute complaints today. See A/p for status of chronic conditions.   She is concerned that her blood pressures have been running a bit high. Home readings have been in the 130s/90s.   She has had some itching in her throat for the last year or so. Symptoms have been stable over that time. Cough drop helps.   Since her last visit she did have a physical exam while in [place].  Reports to me that they found 3 lung nodules however she was not given any specific follow-up.  She does not currently have any symptoms with this.  Lifestyle Diet: Balanced. Gets plenty of fruits and vegetables.  Exercise: Does a lot of walking.      01/07/2022   10:03 AM  Depression screen PHQ 2/9  Decreased Interest 0  Down, Depressed, Hopeless 0  PHQ - 2 Score 0    Health Maintenance Due  Topic Date Due   Medicare Annual Wellness (AWV)  Never done   Hepatitis C Screening  Never done     ROS: Per HPI, otherwise a complete review of systems was negative.   PMH:  The following were reviewed and entered/updated in epic: Past Medical History:  Diagnosis Date   High cholesterol  Stroke [facility])    Patient Active Problem List   Diagnosis Date Noted   Allergic rhinitis 01/07/2022   Lung nodule 01/07/2022   Dyslipidemia 01/07/2022   Osteoarthritis 01/09/2019   Prediabetes 10/12/2018   Neuropathic pain 10/12/2018   History of stroke 10/02/2018   Essential hypertension 10/02/2018   Past Surgical History:  Procedure Laterality Date   HEMORRHOID SURGERY      Family History  Problem  Relation Age of Onset   Heart attack Neg Hx    Stroke Neg Hx     Medications- reviewed and updated Current Outpatient Medications  Medication Sig Dispense Refill   amLODipine (NORVASC) 10 MG tablet Take 1 tablet (10 mg total) by mouth daily. 90 tablet 3   aspirin 81 MG chewable tablet Chew by mouth daily.     atorvastatin (LIPITOR) 40 MG tablet TAKE 1 TABLET BY MOUTH EVERYDAY AT BEDTIME 90 tablet 3   metFORMIN (GLUCOPHAGE-XR) 750 MG 24 hr tablet TAKE 1 TABLET BY MOUTH EVERY DAY WITH BREAKFAST 90 tablet 3   pantoprazole (PROTONIX) 40 MG tablet TAKE 1 TABLET BY MOUTH EVERY DAY IN THE MORNING 90 tablet 3   No current facility-administered medications for this visit.    Allergies-reviewed and updated No Known Allergies  Social History   Socioeconomic History   Marital status: Married    Spouse name: Not on file   Number of children: 1   Years of education: Not on file   Highest education level: Not on file  Occupational History   Not on file  Tobacco Use   Smoking status: Never   Smokeless tobacco: Never  Substance and Sexual Activity   Alcohol use: Never   Drug use: Never   Sexual activity: Not on file  Other Topics Concern   Not on file  Social History Narrative   Not on file   Social Determinants of Health   Financial Resource Strain: Not on file  Food Insecurity: Not on file  Transportation Needs: Not on file  Physical Activity: Not on file  Stress: Not on file  Social Connections: Not on file        Objective:  Physical Exam: BP (!) 164/93   Pulse 66   Temp (!) 97.3 F (36.3 C) (Temporal)   Ht 5\' 1"  (1.549 m)   Wt 137 lb (62.1 kg)   SpO2 98%   BMI 25.89 kg/m   Body mass index is 25.89 kg/m. Wt Readings from Last 3 Encounters:  01/07/22 137 lb (62.1 kg)  01/05/21 138 lb 3.2 oz (62.7 kg)  03/25/20 136 lb (61.7 kg)   Gen: NAD, resting comfortably HEENT: TMs normal bilaterally. OP clear. No thyromegaly noted.  CV: RRR with no murmurs  appreciated Pulm: NWOB, CTAB with no crackles, wheezes, or rhonchi GI: Normal bowel sounds present. Soft, Nontender, Nondistended. MSK: no edema, cyanosis, or clubbing noted Skin: warm, dry Neuro: CN2-12 grossly intact. Strength 5/5 in upper and lower extremities. Reflexes symmetric and intact bilaterally.  Psych: Normal affect and thought content     Chrisangel Eskenazi M. 03/27/20, MD 01/07/2022 10:44 AM

## 2022-01-07 NOTE — Patient Instructions (Signed)
It was very nice to see you today!  Please switch your blood pressure medication to amlodipine.  Stop the valsartan.  Please come back to see me in a couple weeks and we can recheck your blood pressure at that time.  Please try taking allergy medication such as Claritin or Zyrtec to see if this helps with your itchy throat.  We will check a CT scan of your lungs to evaluate the lung nodules.  We will check blood work today.  I will see you back in year for your next physical but please come back to see me in a couple weeks for blood pressure recheck.  Take care, Dr Jerline Pain  PLEASE NOTE:  If you had any lab tests please let us know if you have not heard back within a few days. You may see your results on mychart before we have a chance to review them but we will give you a call once they are reviewed by [place]. If we ordered any referrals today, please let us know if you have not heard from their office within the next week.   Please try these tips to maintain a healthy lifestyle:  Eat at least 3 REAL meals and 1-2 snacks per day.  Aim for no more than 5 hours between eating.  If you eat breakfast, please do so within one hour of getting up.   Each meal should contain half fruits/vegetables, one quarter protein, and one quarter carbs (no bigger than a computer mouse)  Cut down on sweet beverages. This includes juice, soda, and sweet tea.   Drink at least 1 glass of water with each meal and aim for at least 8 glasses per day  Exercise at least 150 minutes every week.    Preventive Care 76 Years and Older, Female Preventive care refers to lifestyle choices and visits with your health care provider that can promote health and wellness. Preventive care visits are also called wellness exams. What can I expect for my preventive care visit? Counseling Your health care provider may ask you questions about your: Medical history, including: Past medical problems. Family medical  history. Pregnancy and menstrual history. History of falls. Current health, including: Memory and ability to understand (cognition). Emotional well-being. Home life and relationship well-being. Sexual activity and sexual health. Lifestyle, including: Alcohol, nicotine or tobacco, and drug use. Access to firearms. Diet, exercise, and sleep habits. Work and work Statistician. Sunscreen use. Safety issues such as seatbelt and bike helmet use. Physical exam Your health care provider will check your: Height and weight. These may be used to calculate your BMI (body mass index). BMI is a measurement that tells if you are at a healthy weight. Waist circumference. This measures the distance around your waistline. This measurement also tells if you are at a healthy weight and may help predict your risk of certain diseases, such as type 2 diabetes and high blood pressure. Heart rate and blood pressure. Body temperature. Skin for abnormal spots. What immunizations do I need?  Vaccines are usually given at various ages, according to a schedule. Your health care provider will recommend vaccines for you based on your age, medical history, and lifestyle or other factors, such as travel or where you work. What tests do I need? Screening Your health care provider may recommend screening tests for certain conditions. This may include: Lipid and cholesterol levels. Hepatitis C test. Hepatitis B test. HIV (human immunodeficiency virus) test. STI (sexually transmitted infection) testing, if you are at  risk. Lung cancer screening. Colorectal cancer screening. Diabetes screening. This is done by checking your blood sugar (glucose) after you have not eaten for a while (fasting). Mammogram. Talk with your health care provider about how often you should have regular mammograms. BRCA-related cancer screening. This may be done if you have a family history of breast, ovarian, tubal, or peritoneal  cancers. Bone density scan. This is done to screen for osteoporosis. Talk with your health care provider about your test results, treatment options, and if necessary, the need for more tests. Follow these instructions at home: Eating and drinking  Eat a diet that includes fresh fruits and vegetables, whole grains, lean protein, and low-fat dairy products. Limit your intake of foods with high amounts of sugar, saturated fats, and salt. Take vitamin and mineral supplements as recommended by your health care provider. Do not drink alcohol if your health care provider tells you not to drink. If you drink alcohol: Limit how much you have to 0-1 drink a day. Know how much alcohol is in your drink. In the [place] one drink equals one 12 oz bottle of beer (355 mL), one 5 oz glass of wine (148 mL), or one 1 oz glass of hard liquor (44 mL). Lifestyle Brush your teeth every morning and night with fluoride toothpaste. Floss one time each day. Exercise for at least 30 minutes 5 or more days each week. Do not use any products that contain nicotine or tobacco. These products include cigarettes, chewing tobacco, and vaping devices, such as e-cigarettes. If you need help quitting, ask your health care provider. Do not use drugs. If you are sexually active, practice safe sex. Use a condom or other form of protection in order to prevent STIs. Take aspirin only as told by your health care provider. Make sure that you understand how much to take and what form to take. Work with your health care provider to find out whether it is safe and beneficial for you to take aspirin daily. Ask your health care provider if you need to take a cholesterol-lowering medicine (statin). Find healthy ways to manage stress, such as: Meditation, yoga, or listening to music. Journaling. Talking to a trusted person. Spending time with friends and family. Minimize exposure to UV radiation to reduce your risk of skin  cancer. Safety Always wear your seat belt while driving or riding in a vehicle. Do not drive: If you have been drinking alcohol. Do not ride with someone who has been drinking. When you are tired or distracted. While texting. If you have been using any mind-altering substances or drugs. Wear a helmet and other protective equipment during sports activities. If you have firearms in your house, make sure you follow all gun safety procedures. What's next? Visit your health care provider once a year for an annual wellness visit. Ask your health care provider how often you should have your eyes and teeth checked. Stay up to date on all vaccines. This information is not intended to replace advice given to you by your health care provider. Make sure you discuss any questions you have with your health care provider. Document Revised: 07/16/2020 Document Reviewed: 07/16/2020 Elsevier Patient Education  [place].

## 2022-01-07 NOTE — Assessment & Plan Note (Signed)
She is on Lipitor 40 mg daily.  Tolerating well.  Will check lipids today.

## 2022-01-08 LAB — HEPATITIS C ANTIBODY: Hepatitis C Ab: NONREACTIVE

## 2022-01-11 NOTE — Progress Notes (Signed)
Please inform patient of the following:  Her cholesterol is extremely elevated.  Recommend referral to cardiology lipid clinic for further management.  Please verify that she has been taking her medications as prescribed.  Her A1c is up slightly since last year.  She should continue to work on diet and exercise and we can recheck in 3-6 months.  We may need to start a medication for this but I would like for her to come back in the office to recheck.  She should continue to work on diet and exercise.  All of her other labs are stable.

## 2022-01-18 ENCOUNTER — Other Ambulatory Visit: Payer: Self-pay | Admitting: *Deleted

## 2022-01-18 DIAGNOSIS — E785 Hyperlipidemia, unspecified: Secondary | ICD-10-CM

## 2022-01-21 ENCOUNTER — Encounter: Payer: Self-pay | Admitting: Family Medicine

## 2022-01-21 ENCOUNTER — Ambulatory Visit (INDEPENDENT_AMBULATORY_CARE_PROVIDER_SITE_OTHER): Payer: Medicare Other | Admitting: Family Medicine

## 2022-01-21 VITALS — BP 130/84 | HR 93 | Temp 98.0°F | Ht 61.0 in | Wt 140.0 lb

## 2022-01-21 DIAGNOSIS — R911 Solitary pulmonary nodule: Secondary | ICD-10-CM

## 2022-01-21 DIAGNOSIS — R7303 Prediabetes: Secondary | ICD-10-CM | POA: Diagnosis not present

## 2022-01-21 DIAGNOSIS — I10 Essential (primary) hypertension: Secondary | ICD-10-CM

## 2022-01-21 DIAGNOSIS — E785 Hyperlipidemia, unspecified: Secondary | ICD-10-CM

## 2022-01-21 NOTE — Assessment & Plan Note (Signed)
Last LDL significantly elevated.  She denies missing any doses of her Lipitor.  She has been referred to advanced lipid clinic.

## 2022-01-21 NOTE — Assessment & Plan Note (Signed)
Last A1c 6.4.  She is on metformin 750 mg daily.  She will come back in 6 months and we can recheck A1c at that time.

## 2022-01-21 NOTE — Assessment & Plan Note (Signed)
Blood pressure at goal today on amlodipine 10 mg daily.  She is doing well with this.  No significant side effects.  She will come back in 6 months and we will recheck at that time.

## 2022-01-21 NOTE — Progress Notes (Signed)
   Tammy Ramos is a 71 y.o. female who presents today for an office visit.  Assessment/Plan:  Chronic Problems Addressed Today: Essential hypertension Blood pressure at goal today on amlodipine 10 mg daily.  She is doing well with this.  No significant side effects.  She will come back in 6 months and we will recheck at that time.  Dyslipidemia Last LDL significantly elevated.  She denies missing any doses of her Lipitor.  She has been referred to advanced lipid clinic.  Lung nodule Will refer for ongoing monitoring   Prediabetes Last A1c 6.4.  She is on metformin 750 mg daily.  She will come back in 6 months and we can recheck A1c at that time.      Subjective:  HPI:  Patient here today for follow-up.  She was seen 2 weeks ago for annual physical.  Had elevated blood pressure reading at that visit.  We switched her valsartan to amlodipine 10 mg daily.  We also check labs at that time which showed significantly elevated LDL at 275. She has been compliant with her lipitor 40mg  daily. No side effects from medications.        Objective:  Physical Exam: BP 130/84   Pulse 93   Temp 98 F (36.7 C) (Temporal)   Ht 5\' 1"  (1.549 m)   Wt 140 lb (63.5 kg)   SpO2 100%   BMI 26.45 kg/m   Gen: No acute distress, resting comfortably CV: Regular rate and rhythm with no murmurs appreciated Pulm: Normal work of breathing, clear to auscultation bilaterally with no crackles, wheezes, or rhonchi Neuro: Grossly normal, moves all extremities Psych: Normal affect and thought content      Tammy Ramos M. , MD 01/21/2022 10:48 AM

## 2022-01-21 NOTE — Assessment & Plan Note (Signed)
Will refer for ongoing monitoring

## 2022-01-21 NOTE — Patient Instructions (Signed)
It was very nice to see you today!  Your blood pressure looks much better today.  We will continue amlodipine  We have referred you to see a cholesterol specialist.  They should call to schedule appoint with you in a couple of weeks.  Please come back to see me in 6 months to recheck your blood sugar and blood pressure.  Come back sooner if needed.  Take care, Dr Jimmey Ralph  PLEASE NOTE:  If you had any lab tests, please let us know if you have not heard back within a few days. You may see your results on mychart before we have a chance to review them but we will give you a call once they are reviewed by [place].   If we ordered any referrals today, please let us know if you have not heard from their office within the next week.   If you had any urgent prescriptions sent in today, please check with the pharmacy within an hour of our visit to make sure the prescription was transmitted appropriately.   Please try these tips to maintain a healthy lifestyle:  Eat at least 3 REAL meals and 1-2 snacks per day.  Aim for no more than 5 hours between eating.  If you eat breakfast, please do so within one hour of getting up.   Each meal should contain half fruits/vegetables, one quarter protein, and one quarter carbs (no bigger than a computer mouse)  Cut down on sweet beverages. This includes juice, soda, and sweet tea.   Drink at least 1 glass of water with each meal and aim for at least 8 glasses per day  Exercise at least 150 minutes every week.

## 2022-01-26 ENCOUNTER — Ambulatory Visit (HOSPITAL_COMMUNITY): Payer: Medicare Other

## 2022-02-03 ENCOUNTER — Other Ambulatory Visit: Payer: Self-pay | Admitting: Family Medicine

## 2022-02-05 ENCOUNTER — Ambulatory Visit (HOSPITAL_COMMUNITY): Payer: BLUE CROSS/BLUE SHIELD

## 2022-02-08 ENCOUNTER — Telehealth: Payer: Self-pay | Admitting: Family Medicine

## 2022-02-08 ENCOUNTER — Ambulatory Visit (INDEPENDENT_AMBULATORY_CARE_PROVIDER_SITE_OTHER): Payer: Medicare Other | Admitting: Family

## 2022-02-08 ENCOUNTER — Encounter: Payer: Self-pay | Admitting: Family

## 2022-02-08 VITALS — BP 130/82 | HR 91 | Temp 98.0°F | Ht 61.0 in | Wt 139.4 lb

## 2022-02-08 DIAGNOSIS — M79604 Pain in right leg: Secondary | ICD-10-CM | POA: Diagnosis not present

## 2022-02-08 NOTE — Telephone Encounter (Signed)
Pt's spouse states CT was denied by insurance. He is asking what needs to be done now. Please advise

## 2022-02-08 NOTE — Progress Notes (Signed)
Patient ID: Tammy Ramos, female    DOB: 10/21/1950, 72 y.o.   MRN: [id number]  Chief Complaint  Patient presents with   Leg Pain    Pt c/o back of right  knee pain. Present for about a week. Has not tried any medications for pain. Back of knee is sore and hurts to walk on.     HPI:      Leg pain:  reports pain behind her right knee. reports pain starting a little over a week ago, states "burning, tight pain" going down the leg. Reports mild redness when first started, with swelling in top of knee, but not behind her knee. She states it hurts mostly with standing, walking, and sitting, and if she turns her leg outward while sitting, but does not hurt if she elevates her leg. Pt takes a baby ASA qd, denies any hx of DVT but did have a stroke about 81yrs ago, of unknown etiology. Husband w/pt states she does not sit for long periods, usually up and moving around, walking.      Assessment & Plan:  1. Right leg pain - swelling noted on anterior knee, no erythema or warmth, pt low risk for DVT, possibly OA, but d/t symptoms, age, high chol, CVA hx, will order U/S. Advised pt to keep leg elevated, hydrate well with 2L water qd, if pain becomes worse, with swelling and/or redness/warmth, go to the ER.  - VAS [place] LOWER EXTREMITY VENOUS (DVT)   Subjective:    Outpatient Medications Prior to Visit  Medication Sig Dispense Refill   amLODipine (NORVASC) 10 MG tablet Take 1 tablet (10 mg total) by mouth daily. 90 tablet 3   aspirin 81 MG chewable tablet Chew by mouth daily.     atorvastatin (LIPITOR) 40 MG tablet TAKE 1 TABLET BY MOUTH EVERYDAY AT BEDTIME 90 tablet 3   metFORMIN (GLUCOPHAGE-XR) 750 MG 24 hr tablet TAKE 1 TABLET BY MOUTH EVERY DAY WITH BREAKFAST 90 tablet 3   pantoprazole (PROTONIX) 40 MG tablet TAKE 1 TABLET BY MOUTH EVERY DAY IN THE MORNING 90 tablet 3   No facility-administered medications prior to visit.   Past Medical History:  Diagnosis Date   High cholesterol    Stroke  [facility])    Past Surgical History:  Procedure Laterality Date   HEMORRHOID SURGERY     No Known Allergies    Objective:    Physical Exam Vitals and nursing note reviewed.  Constitutional:      Appearance: Normal appearance.  Cardiovascular:     Rate and Rhythm: Normal rate and regular rhythm.  Pulmonary:     Effort: Pulmonary effort is normal.     Breath sounds: Normal breath sounds.  Musculoskeletal:     Right knee: Effusion (mild anterior) present. No erythema, ecchymosis, bony tenderness or crepitus. Normal range of motion.     Right lower leg: No edema.     Left lower leg: No edema.  Skin:    General: Skin is warm and dry.  Neurological:     Mental Status: She is alert.  Psychiatric:        Mood and Affect: Mood normal.        Behavior: Behavior normal.    BP 130/82 (BP Location: Right Arm, Patient Position: Sitting, Cuff Size: Large)   Pulse 91   Temp 98 F (36.7 C) (Temporal)   Ht 5\' 1"  (1.549 m)   Wt 139 lb 6 oz (63.2 kg)   SpO2 99%  BMI 26.33 kg/m  Wt Readings from Last 3 Encounters:  02/08/22 139 lb 6 oz (63.2 kg)  01/21/22 140 lb (63.5 kg)  01/07/22 137 lb (62.1 kg)       Jeanie Sewer, NP

## 2022-02-09 NOTE — Telephone Encounter (Signed)
Please advise 

## 2022-02-10 ENCOUNTER — Ambulatory Visit: Payer: Medicare Other | Attending: Family

## 2022-02-10 DIAGNOSIS — M79604 Pain in right leg: Secondary | ICD-10-CM

## 2022-02-10 NOTE — Progress Notes (Signed)
Call pt husband -  No DVT found on the ultrasound - good news! For her knee pain she can take 1 generic Aleve twice a day (unless told not take NSAIDs due to previous stomach ulcer, or other reason) or take Tylenol arthritis strength 3 times per day.  She can also use Diclofenac sodium gel (voltaren) 4 times per day, this is a topical anti-inflammatory medication that can help pain and swelling. You can find it OTC, but if they want me to send in I can.  Conservative treatment is recommended to try first, but if she is still getting no relief we can refer her to [facility], Thx

## 2022-02-10 NOTE — Telephone Encounter (Signed)
I referred them to pulmonology. Please give them their contact info so that they can call and make an appointment. Looks like pulm has reached out to them twice already.  Algis Greenhouse. Jerline Pain, MD 02/10/2022 7:26 AM

## 2022-02-11 NOTE — Telephone Encounter (Signed)
Send message to [facility] referral coordinator for information

## 2022-02-12 ENCOUNTER — Encounter: Payer: Self-pay | Admitting: Family Medicine

## 2022-02-12 NOTE — Telephone Encounter (Signed)
LVM with pulmonology phone number [phone] Please give them a call to schedule visit

## 2022-02-23 ENCOUNTER — Telehealth: Payer: Self-pay

## 2022-02-23 NOTE — Telephone Encounter (Signed)
Spoke to pt's husband and he informed me that his wife had a CT in [place] and they can not get the CT from [place]. The husband also stated that his wife wasn't able to get the CT that Dr Jerline Pain ordered because their insurance will not pay for the CT. Pt's husband said he is willing to pay for the CT as long as it's not too much money. I gave him the number to [facility]. To see how much it will cost to have a CT without using their insurance but pt's husband wasn't able to get through. I will follow-up with pt before time of appt.

## 2022-02-24 ENCOUNTER — Telehealth: Payer: Self-pay

## 2022-02-24 NOTE — Telephone Encounter (Signed)
Called and left detailed message on husbands vm about appt. to be rescheduled and for Tammy Ramos to have CT before rescheduling. Appt with Dr Valeta Harms is cancelled.

## 2022-03-04 ENCOUNTER — Institutional Professional Consult (permissible substitution): Payer: Medicare Other | Admitting: Pulmonary Disease

## 2022-03-15 ENCOUNTER — Telehealth: Payer: Self-pay

## 2022-03-15 NOTE — Telephone Encounter (Signed)
Spoke to pt's husband and was informed his wife got the CT report from [place] but no images. And still has not gotten CT scan ordered by PCP because of ins.

## 2022-03-19 ENCOUNTER — Institutional Professional Consult (permissible substitution): Payer: Medicare Other | Admitting: Pulmonary Disease

## 2022-04-04 NOTE — Progress Notes (Unsigned)
Synopsis: Referred in March 2024 for abnormal CT chest by Vivi Barrack, MD  Subjective:   PATIENT ID: Tammy Ramos GENDER: female DOB: Jun 27, 1950, MRN: [id number]  No chief complaint on file.   This is a 72 year old female, past medical history of hypercholesterolemia and stroke.  She had CT imaging completed in [place]. ***    ***  Past Medical History:  Diagnosis Date   High cholesterol    Stroke ([place])      Family History  Problem Relation Age of Onset   Heart attack Neg Hx    Stroke Neg Hx      Past Surgical History:  Procedure Laterality Date   HEMORRHOID SURGERY      Social History   Socioeconomic History   Marital status: Married    Spouse name: Not on file   Number of children: 1   Years of education: Not on file   Highest education level: Not on file  Occupational History   Not on file  Tobacco Use   Smoking status: Never   Smokeless tobacco: Never  Substance and Sexual Activity   Alcohol use: Never   Drug use: Never   Sexual activity: Not on file  Other Topics Concern   Not on file  Social History Narrative   Not on file   Social Determinants of Health   Financial Resource Strain: Not on file  Food Insecurity: Not on file  Transportation Needs: Not on file  Physical Activity: Not on file  Stress: Not on file  Social Connections: Not on file  Intimate Partner Violence: Not on file     No Known Allergies   Outpatient Medications Prior to Visit  Medication Sig Dispense Refill   amLODipine (NORVASC) 10 MG tablet Take 1 tablet (10 mg total) by mouth daily. 90 tablet 3   aspirin 81 MG chewable tablet Chew by mouth daily.     atorvastatin (LIPITOR) 40 MG tablet TAKE 1 TABLET BY MOUTH EVERYDAY AT BEDTIME 90 tablet 3   metFORMIN (GLUCOPHAGE-XR) 750 MG 24 hr tablet TAKE 1 TABLET BY MOUTH EVERY DAY WITH BREAKFAST 90 tablet 3   pantoprazole (PROTONIX) 40 MG tablet TAKE 1 TABLET BY MOUTH EVERY DAY IN THE MORNING 90 tablet 3   No  facility-administered medications prior to visit.    ROS   Objective:  Physical Exam   There were no vitals filed for this visit.   on *** LPM *** RA BMI Readings from Last 3 Encounters:  02/08/22 26.33 kg/m  01/21/22 26.45 kg/m  01/07/22 25.89 kg/m   Wt Readings from Last 3 Encounters:  02/08/22 139 lb 6 oz (63.2 kg)  01/21/22 140 lb (63.5 kg)  01/07/22 137 lb (62.1 kg)     CBC    Component Value Date/Time   WBC 5.2 01/07/2022 1043   RBC 5.06 01/07/2022 1043   HGB 15.1 (H) 01/07/2022 1043   HCT 44.0 01/07/2022 1043   PLT 280.0 01/07/2022 1043   MCV 86.9 01/07/2022 1043   MCH 30.2 01/07/2020 0834   MCHC 34.3 01/07/2022 1043   RDW 12.6 01/07/2022 1043    ***  Chest Imaging: ***  Pulmonary Functions Testing Results:     No data to display          FeNO: ***  Pathology: ***  Echocardiogram: ***  Heart Catheterization: ***    Assessment & Plan:   No diagnosis found.  Discussion: ***   Current Outpatient Medications:    amLODipine (  NORVASC) 10 MG tablet, Take 1 tablet (10 mg total) by mouth daily., Disp: 90 tablet, Rfl: 3   aspirin 81 MG chewable tablet, Chew by mouth daily., Disp: , Rfl:    atorvastatin (LIPITOR) 40 MG tablet, TAKE 1 TABLET BY MOUTH EVERYDAY AT BEDTIME, Disp: 90 tablet, Rfl: 3   metFORMIN (GLUCOPHAGE-XR) 750 MG 24 hr tablet, TAKE 1 TABLET BY MOUTH EVERY DAY WITH BREAKFAST, Disp: 90 tablet, Rfl: 3   pantoprazole (PROTONIX) 40 MG tablet, TAKE 1 TABLET BY MOUTH EVERY DAY IN THE MORNING, Disp: 90 tablet, Rfl: 3  I spent *** minutes dedicated to the care of this patient on the date of this encounter to include pre-visit review of records, face-to-face time with the patient discussing conditions above, post visit ordering of testing, clinical documentation with the electronic health record, making appropriate referrals as documented, and communicating necessary findings to members of the patients care team.   Garner Nash,  DO [facility] 04/04/2022 5:28 PM

## 2022-04-05 ENCOUNTER — Ambulatory Visit: Payer: Medicare Other | Admitting: Pulmonary Disease

## 2022-04-05 ENCOUNTER — Encounter: Payer: Self-pay | Admitting: Pulmonary Disease

## 2022-04-05 VITALS — BP 120/80 | HR 75 | Ht 61.0 in | Wt 138.8 lb

## 2022-04-05 DIAGNOSIS — Z789 Other specified health status: Secondary | ICD-10-CM | POA: Diagnosis not present

## 2022-04-05 DIAGNOSIS — R918 Other nonspecific abnormal finding of lung field: Secondary | ICD-10-CM | POA: Diagnosis not present

## 2022-04-05 NOTE — Patient Instructions (Signed)
Thank you for visiting Dr. Valeta Harms at [facility]. Today we recommend the following:  Orders Placed This Encounter  Procedures   CT Chest Wo Contrast   Please see [place] after CT complete   Return in about 3 weeks (around 04/26/2022) for Eric Form, NP or APP .    Please do your part to reduce the spread of COVID-19.

## 2022-04-08 ENCOUNTER — Telehealth: Payer: Self-pay

## 2022-04-08 NOTE — Telephone Encounter (Signed)
Called patient to schedule Medicare Annual Wellness Visit (AWV). Unable to reach patient.  Last date of AWV: 03/04/17  Please schedule an appointment at any time with [facility].  Norton Blizzard, [place] ([facility])  [facility] [phone]

## 2022-04-12 ENCOUNTER — Ambulatory Visit
Admission: RE | Admit: 2022-04-12 | Discharge: 2022-04-12 | Disposition: A | Discharge status: Home / Self Care | Payer: Medicare Other | Source: Ambulatory Visit | Attending: Pulmonary Disease | Admitting: Pulmonary Disease

## 2022-04-12 DIAGNOSIS — I700 Atherosclerosis of aorta: Secondary | ICD-10-CM | POA: Diagnosis not present

## 2022-04-12 DIAGNOSIS — R911 Solitary pulmonary nodule: Secondary | ICD-10-CM | POA: Diagnosis not present

## 2022-04-12 DIAGNOSIS — R918 Other nonspecific abnormal finding of lung field: Secondary | ICD-10-CM

## 2022-04-15 NOTE — Progress Notes (Signed)
SG, FYI seeing you tomorrow. No new nodules compared to CT from [place]. I documented the results in my ov note from ct completed in [place]   Thanks,  Dimmit, DO [facility] 04/15/2022 5:05 PM

## 2022-04-16 ENCOUNTER — Encounter: Payer: Self-pay | Admitting: Acute Care

## 2022-04-16 ENCOUNTER — Ambulatory Visit: Payer: Medicare Other | Admitting: Acute Care

## 2022-04-16 VITALS — BP 124/76 | HR 75 | Temp 98.3°F | Ht 61.0 in | Wt 138.6 lb

## 2022-04-16 DIAGNOSIS — R911 Solitary pulmonary nodule: Secondary | ICD-10-CM | POA: Diagnosis not present

## 2022-04-16 DIAGNOSIS — I2510 Atherosclerotic heart disease of native coronary artery without angina pectoris: Secondary | ICD-10-CM | POA: Diagnosis not present

## 2022-04-16 NOTE — Patient Instructions (Addendum)
It is good to see you today. Your CT scan shows no new nodules when compared to the CT Chest from [place].  This is good news. We will do a 12 month CT Chest to monitor for any changes. You will get a call to get that scheduled.  I will refer you to cardiology for the enlarged heart, Three-vessel coronary artery calcifications , and  The atherosclerotic calcification of the aorta without evidence of aneurysm.  You will get a call to get a visit scheduled.  Follow up in 12 months after CT Scan to review results Please contact office for sooner follow up if symptoms do not improve or worsen or seek emergency care

## 2022-04-16 NOTE — Progress Notes (Signed)
History of Present Illness Tammy Ramos is a 72 y.o. female never smoker from [place] referred by Dr. Dimas Chyle seen by Dr. Valeta Harms 04/05/2022 for evaluation of a pulmonary nodule.    Synopsis 72 year old female, past medical history of hypercholesterolemia and stroke. She had CT imaging completed in [place]. Patient is a lifelong non-smoker. However she did grow up in [place]. She moved here 15 years ago. She had a CT scan of the chest for evaluation when she was in [place] in May 2023. That CT scan of the chest revealed multiple groundglass nodules that were found in the upper lobe of the right lung and is well as the left lung. The larger 1 is in the left upper lobe which measures about 7 mm x 5 mm and there is a solid nodule in the right middle lobe. No family history of lung cancer. She also has atherosclerosis in her coronary arteries and some degenerative changes in the spine. She was seen by Dr. Valeta Harms 04/05/2022. He explained to her that Asian females have a 1.5% increase incidence of adenocarcinoma that are EGFR mutated. He ordered a repeat CT Chest as it has been a year since previous scan. No family history of cancer .  04/16/2022 Pt. Presents for  follow up of CT chest for lung nodule noted on a CT Chest  done in [place] a year ago. Patient retired from work at [employer], and [employer] does a free health assessment upon retirement, and the lung nodule was noted at that time.  She was referred to see Dr. Valeta Harms for follow-up by Sudie Bailey, MD, as she has a 1.5% increased risk incidence of adenocarcinoma often seen in age in females. Repeat imaging shows no evidence of pulmonary nodule or mass.,  However it is concerning for an enlarged heart, three-vessel coronary artery calcifications, and atherosclerotic calcification of the aorta without evidence of aneurysm.  Patient states that her brother has a significant cardiac history.  I am going to refer to cardiology to have her further  evaluated.  Test Results: 04/13/2022 CT chest without contrast Cardiovascular: The heart is enlarged and there is no pericardial effusion. Three-vessel coronary artery calcifications are noted. There is atherosclerotic calcification of the aorta without evidence of aneurysm. The pulmonary trunk is normal in caliber.   Mediastinum/Nodes: No mediastinal or axillary lymphadenopathy. Evaluation of the hila is limited due to lack of IV contrast. The thyroid gland, trachea, and esophagus are within normal limits.   Lungs/Pleura: Mild atelectasis or scarring is noted bilaterally. No effusion or pneumothorax. Calcified granuloma are noted in the left lung.   Upper Abdomen: No acute abnormality.   Musculoskeletal: There is a 1.1 cm nodule in the right breast, likely corresponding to cyst on prior ultrasound. Degenerative changes are present in the thoracic spine. No acute or suspicious osseous abnormality.   IMPRESSION: 1. No evidence of pulmonary nodule or mass. 2. Aortic atherosclerosis and coronary artery calcifications.    06/2021 CT Chest>> done in [place], we have a read but no imaging Multiple groundglass nodules that were found in the upper lobe of the right lung and is well as the left lung. The larger 1 is in the left upper lobe which measures about 7 mm x 5 mm and there is a solid nodule in the right middle lobe measuring approximately 4 mm x 3 mm.  CT diagnosis: 1.  Multiple nodules and inflammatory nodules in both lungs.  Regular review is recommended 2.  Atherosclerosis of  the aorta and coronary arteries 3.  Degenerative changes in the thoracic spine.   Complete dictation with translation of above chinese scan 06/2021   Multiple groundglass nodules were found in the right upper lobe of the lung image 83 and the upper lobe of the left lung image 50, image 62, image 73.  The larger 1 was located in the posterior tip of the upper lobe of the left lung, image 73, with a size of  approximately 7 mm x 5 mm.  A solid nodule was found in the right middle lobe of the lung image 174, measuring approximately 4 mm x 3 mm.  No abnormal density was found in the remaining lung lobes.  Trachea and bronchi were nonobstructed and no enlarged lymph nodes were found in the hilum and mediastinum.  There was no thickening of the bilateral pleura and no effusion in the pleural cavity.  Patchy density lesions were seen on the walls of the aorta and coronary arteries multiple bone hyperplasia in the thoracic spine. CT diagnosis: 1.  Multiple nodules and inflammatory nodules in both lungs.  Regular review is recommended 2.  Atherosclerosis of the aorta and coronary arteries 3.  Degenerative changes in the thoracic spine.     Latest Ref Rng & Units 01/07/2022   10:43 AM 01/05/2021    3:43 PM 01/07/2020    8:34 AM  CBC  WBC 4.0 - 10.5 K/uL 5.2  6.4  5.3   Hemoglobin 12.0 - 15.0 g/dL 15.1  13.4  14.0   Hematocrit 36.0 - 46.0 % 44.0  39.4  40.9   Platelets 150.0 - 400.0 K/uL 280.0  225.0  249        Latest Ref Rng & Units 01/07/2022   10:43 AM 01/05/2021    3:43 PM 01/07/2020    8:34 AM  BMP  Glucose 70 - 99 mg/dL 105  87  144   BUN 6 - 23 mg/dL 12  16  12    Creatinine 0.40 - 1.20 mg/dL 0.83  0.80  0.80   BUN/Creat Ratio 6 - 22 (calc)   NOT APPLICABLE   Sodium A999333 - 145 mEq/L 134  134  135   Potassium 3.5 - 5.1 mEq/L 4.5  4.6  4.5   Chloride 96 - 112 mEq/L 93  98  96   CO2 19 - 32 mEq/L 31  28  29    Calcium 8.4 - 10.5 mg/dL 10.1  10.5  10.0     BNP No results found for: "BNP"  ProBNP No results found for: "PROBNP"  PFT No results found for: "FEV1PRE", "FEV1POST", "FVCPRE", "FVCPOST", "TLC", "DLCOUNC", "PREFEV1FVCRT", "PSTFEV1FVCRT"  CT Chest Wo Contrast  Result Date: 04/13/2022 CLINICAL DATA:  Lung nodules. EXAM: CT CHEST WITHOUT CONTRAST TECHNIQUE: Multidetector CT imaging of the chest was performed following the standard protocol without IV contrast. RADIATION DOSE  REDUCTION: This exam was performed according to the departmental dose-optimization program which includes automated exposure control, adjustment of the mA and/or kV according to patient size and/or use of iterative reconstruction technique. COMPARISON:  None Available. FINDINGS: Cardiovascular: The heart is enlarged and there is no pericardial effusion. Three-vessel coronary artery calcifications are noted. There is atherosclerotic calcification of the aorta without evidence of aneurysm. The pulmonary trunk is normal in caliber. Mediastinum/Nodes: No mediastinal or axillary lymphadenopathy. Evaluation of the hila is limited due to lack of IV contrast. The thyroid gland, trachea, and esophagus are within normal limits. Lungs/Pleura: Mild atelectasis or scarring is noted bilaterally.  No effusion or pneumothorax. Calcified granuloma are noted in the left lung. Upper Abdomen: No acute abnormality. Musculoskeletal: There is a 1.1 cm nodule in the right breast, likely corresponding to cyst on prior ultrasound. Degenerative changes are present in the thoracic spine. No acute or suspicious osseous abnormality. IMPRESSION: 1. No evidence of pulmonary nodule or mass. 2. Aortic atherosclerosis and coronary artery calcifications. Electronically Signed   By: Brett Fairy M.D.   On: 04/13/2022 03:51     Past medical hx Past Medical History:  Diagnosis Date   High cholesterol    Stroke [facility])      Social History   Tobacco Use   Smoking status: Never   Smokeless tobacco: Never  Substance Use Topics   Alcohol use: Never   Drug use: Never    Ms.Dressel reports that she has never smoked. She has never used smokeless tobacco. She reports that she does not drink alcohol and does not use drugs.  Tobacco Cessation: Never smoker   Past surgical hx, Family hx, Social hx all reviewed.  Current Outpatient Medications on File Prior to Visit  Medication Sig   amLODipine (NORVASC) 10 MG tablet Take 1 tablet (10 mg  total) by mouth daily.   aspirin 81 MG chewable tablet Chew by mouth daily.   atorvastatin (LIPITOR) 40 MG tablet TAKE 1 TABLET BY MOUTH EVERYDAY AT BEDTIME   metFORMIN (GLUCOPHAGE-XR) 750 MG 24 hr tablet TAKE 1 TABLET BY MOUTH EVERY DAY WITH BREAKFAST   pantoprazole (PROTONIX) 40 MG tablet TAKE 1 TABLET BY MOUTH EVERY DAY IN THE MORNING   No current facility-administered medications on file prior to visit.     No Known Allergies  Review Of Systems:  Constitutional:   No  weight loss, night sweats,  Fevers, chills, fatigue, or  lassitude.  HEENT:   No headaches,  Difficulty swallowing,  Tooth/dental problems, or  Sore throat,                No sneezing, itching, ear ache, nasal congestion, post nasal drip,   CV:  No chest pain,  Orthopnea, PND, swelling in lower extremities, anasarca, dizziness, palpitations, syncope.   GI  No heartburn, indigestion, abdominal pain, nausea, vomiting, diarrhea, change in bowel habits, loss of appetite, bloody stools.   Resp: No shortness of breath with exertion or at rest.  No excess mucus, no productive cough,  No non-productive cough,  No coughing up of blood.  No change in color of mucus.  No wheezing.  No chest wall deformity  Skin: no rash or lesions.  GU: no dysuria, change in color of urine, no urgency or frequency.  No flank pain, no hematuria   MS:  No joint pain or swelling.  No decreased range of motion.  No back pain.  Psych:  No change in mood or affect. No depression or anxiety.  No memory loss.   Vital Signs BP 124/76 (BP Location: Left Arm, Patient Position: Sitting, Cuff Size: Normal)   Pulse 75   Temp 98.3 F (36.8 C) (Oral)   Ht 5\' 1"  (1.549 m)   Wt 138 lb 9.6 oz (62.9 kg)   SpO2 97%   BMI 26.19 kg/m    Physical Exam:  General- No distress,  A&Ox3, slight language barrier but her husband works to help interpret ENT: No sinus tenderness, TM clear, pale nasal mucosa, no oral exudate,no post nasal drip, no LAN Cardiac:  S1, S2, regular rate and rhythm, no murmur Chest: No wheeze/ rales/  dullness; no accessory muscle use, no nasal flaring, no sternal retractions Abd.: Soft Non-tender, nondistended, bowel sounds positive,Body mass index is 26.19 kg/m.  Ext: No clubbing cyanosis, edema Neuro:  normal strength, moving all extremities x 4, alert and oriented x 3, appropriate Skin: No rashes, warm and dry, darkened mole to right chest Psych: normal mood and behavior, manages well with slight language barrier   Assessment/Plan Multiple nodules and inflammatory nodules in both lungs on May 2023 CT scan done in [place] At follow-up 04/21/2022>> No evidence of pulmonary nodule or mass.   Plan Your CT scan shows no new nodules when compared to the CT Chest from [place].  This is good news. We will do a 12 month CT Chest to monitor for any changes. You will get a call to get that scheduled.  Follow up after scan in 12 months  Aortic atherosclerosis and coronary artery calcifications. Enlarged heart, Three-vessel coronary artery calcifications  Family history of heart disease Plan I will refer you to cardiology for the enlarged heart, Three-vessel coronary artery calcifications , and  The atherosclerotic calcification of the aorta without evidence of aneurysm.  You will get a call to get a visit scheduled.   I spent 35 minutes dedicated to the care of this patient on the date of this encounter to include pre-visit review of records, face-to-face time with the patient discussing conditions above, post visit ordering of testing, clinical documentation with the electronic health record, making appropriate referrals as documented, and communicating necessary information to the patient's healthcare team.   Magdalen Spatz, NP 04/16/2022  9:38 AM

## 2022-05-06 ENCOUNTER — Telehealth: Payer: Self-pay | Admitting: Family Medicine

## 2022-05-06 NOTE — Telephone Encounter (Signed)
Contacted Tammy Ramos to schedule their annual wellness visit. Appointment made for 05/17/2022.  [place] Direct Dial [phone]

## 2022-05-17 ENCOUNTER — Ambulatory Visit: Payer: Medicare Other

## 2022-05-17 VITALS — BP 120/64 | HR 79 | Temp 98.2°F | Wt 135.8 lb

## 2022-05-17 DIAGNOSIS — Z0000 Encounter for general adult medical examination without abnormal findings: Secondary | ICD-10-CM

## 2022-05-17 NOTE — Progress Notes (Signed)
Subjective:   Tammy Ramos is a 72 y.o. female who presents for an Initial Medicare Annual Wellness Visit. Along with [name] her husband and interpreter   Review of Systems     Cardiac Risk Factors include: advanced age (>76men, >71 women);dyslipidemia;hypertension     Objective:    Today's Vitals   05/17/22 1017  BP: 120/64  Pulse: 79  Temp: 98.2 F (36.8 C)  SpO2: 97%  Weight: 135 lb 12.8 oz (61.6 kg)   Body mass index is 25.66 kg/m.     05/17/2022   10:23 AM  Advanced Directives  Does Patient Have a Medical Advance Directive? No  Would patient like information on creating a medical advance directive? No - Patient declined    Current Medications (verified) Outpatient Encounter Medications as of 05/17/2022  Medication Sig   amLODipine (NORVASC) 10 MG tablet Take 1 tablet (10 mg total) by mouth daily.   aspirin 81 MG chewable tablet Chew by mouth daily.   atorvastatin (LIPITOR) 40 MG tablet TAKE 1 TABLET BY MOUTH EVERYDAY AT BEDTIME   metFORMIN (GLUCOPHAGE-XR) 750 MG 24 hr tablet TAKE 1 TABLET BY MOUTH EVERY DAY WITH BREAKFAST   pantoprazole (PROTONIX) 40 MG tablet TAKE 1 TABLET BY MOUTH EVERY DAY IN THE MORNING   No facility-administered encounter medications on file as of 05/17/2022.    Allergies (verified) Patient has no known allergies.   History: Past Medical History:  Diagnosis Date   High cholesterol    Stroke    Past Surgical History:  Procedure Laterality Date   HEMORRHOID SURGERY     Family History  Problem Relation Age of Onset   Heart attack Neg Hx    Stroke Neg Hx    Social History   Socioeconomic History   Marital status: Married    Spouse name: Not on file   Number of children: 1   Years of education: Not on file   Highest education level: Not on file  Occupational History   Not on file  Tobacco Use   Smoking status: Never   Smokeless tobacco: Never  Substance and Sexual Activity   Alcohol use: Never   Drug use: Never    Sexual activity: Not on file  Other Topics Concern   Not on file  Social History Narrative   Not on file   Social Determinants of Health   Financial Resource Strain: Low Risk  (05/17/2022)   Overall Financial Resource Strain (CARDIA)    Difficulty of Paying Living Expenses: Not hard at all  Food Insecurity: No Food Insecurity (05/17/2022)   Hunger Vital Sign    Worried About Running Out of Food in the Last Year: Never true    Ran Out of Food in the Last Year: Never true  Transportation Needs: No Transportation Needs (05/17/2022)   PRAPARE - Administrator, Civil Service (Medical): No    Lack of Transportation (Non-Medical): No  Physical Activity: Insufficiently Active (05/17/2022)   Exercise Vital Sign    Days of Exercise per Week: 3 days    Minutes of Exercise per Session: 40 min  Stress: No Stress Concern Present (05/17/2022)   [employer] of Occupational Health - Occupational Stress Questionnaire    Feeling of Stress : Not at all  Social Connections: Moderately Isolated (05/17/2022)   Social Connection and Isolation Panel [NHANES]    Frequency of Communication with Friends and Family: More than three times a week    Frequency of Social Gatherings with  Friends and Family: More than three times a week    Attends Religious Services: Never    Database administrator or Organizations: No    Attends Engineer, structural: Never    Marital Status: Married    Tobacco Counseling Counseling given: Not Answered   Clinical Intake:  Pre-visit preparation completed: Yes  Pain : No/denies pain     BMI - recorded: 25.66 Nutritional Status: BMI 25 -29 Overweight Nutritional Risks: None Diabetes: No  How often do you need to have someone help you when you read instructions, pamphlets, or other written materials from your doctor or pharmacy?: 1 - Never  Diabetic?no  Interpreter Needed?: No  Information entered by :: Lanier Ensign LPN   Activities of  Daily Living    05/17/2022   10:24 AM  In your present state of health, do you have any difficulty performing the following activities:  Hearing? 0  Vision? 0  Difficulty concentrating or making decisions? 0  Walking or climbing stairs? 0  Dressing or bathing? 0  Doing errands, shopping? 0  Preparing Food and eating ? N  Using the Toilet? N  In the past six months, have you accidently leaked urine? N  Do you have problems with loss of bowel control? N  Managing your Medications? N  Managing your Finances? N  Housekeeping or managing your Housekeeping? N    Patient Care Team: Ardith Dark, MD as PCP - General (Family Medicine)  Indicate any recent Medical Services you may have received from other than [facility] providers in the past year (date may be approximate).     Assessment:   This is a routine wellness examination for Tammy Ramos.  Hearing/Vision screen Hearing Screening - Comments:: Pt denies any hearing issues  Vision Screening - Comments:: Pt follows up with [facility] for annual eye exams   Dietary issues and exercise activities discussed:     Goals Addressed             This Visit's Progress    Patient Stated       None at this time        Depression Screen    05/17/2022   10:21 AM 01/21/2022   10:24 AM 01/07/2022   10:03 AM 01/05/2021    3:01 PM 01/07/2020    8:04 AM 03/20/2018   11:53 AM 02/20/2018    3:51 PM  PHQ 2/9 Scores  PHQ - 2 Score 0 0 0 0 0 2 3  PHQ- 9 Score       16    Fall Risk    05/17/2022   10:24 AM 01/21/2022   10:24 AM 01/07/2022   10:04 AM 01/05/2021    3:01 PM 01/07/2020    8:04 AM  Fall Risk   Falls in the past year? 0 0 0 0 0  Number falls in past yr: 0 0 0 0   Injury with Fall? 0 0 0 0   Risk for fall due to : Impaired vision No Fall Risks No Fall Risks No Fall Risks   Follow up Falls prevention discussed        FALL RISK PREVENTION PERTAINING TO THE HOME:  Any stairs in or around the home? Yes  If so, are there any without  handrails? No  Home free of loose throw rugs in walkways, pet beds, electrical cords, etc? Yes  Adequate lighting in your home to reduce risk of falls? Yes   ASSISTIVE DEVICES UTILIZED TO PREVENT  FALLS:  Life alert? No  Use of a cane, walker or w/c? No  Grab bars in the bathroom? Yes  Shower chair or bench in shower? No  Elevated toilet seat or a handicapped toilet? No   TIMED UP AND GO:  Was the test performed? Yes .  Length of time to ambulate 10 feet: 10 sec.   Gait steady and fast without use of assistive device  Cognitive Function:        Immunizations Immunization History  Administered Date(s) Administered   H1N1 01/18/2008   Influenza, High Dose Seasonal PF 11/20/2021   Influenza-Unspecified 01/02/2020, 12/09/2020   Moderna Sars-Covid-2 Vaccination 03/18/2019, 04/16/2019, 12/19/2019   PNEUMOCOCCAL CONJUGATE-20 01/05/2021   Zoster Recombinat (Shingrix) 04/30/2020, 07/08/2020      Flu Vaccine status: Up to date  Pneumococcal vaccine status: Up to date  Covid-19 vaccine status: Completed vaccines  Qualifies for Shingles Vaccine? Yes   Zostavax completed Yes   Shingrix Completed?: Yes  Screening Tests Health Maintenance  Topic Date Due   COVID-19 Vaccine (4 - 2023-24 season) 10/02/2021   INFLUENZA VACCINE  09/02/2022   MAMMOGRAM  02/07/2023   Medicare Annual Wellness (AWV)  05/17/2023   COLONOSCOPY (Pts 45-49yrs Insurance coverage will need to be confirmed)  08/28/2023   Pneumonia Vaccine 45+ Years old  Completed   DEXA SCAN  Completed   Hepatitis C Screening  Completed   Zoster Vaccines- Shingrix  Completed   HPV VACCINES  Aged Out   DTaP/Tdap/Td  Discontinued    Health Maintenance  Health Maintenance Due  Topic Date Due   COVID-19 Vaccine (4 - 2023-24 season) 10/02/2021    Colorectal cancer screening: Type of screening: Colonoscopy. Completed 08/27/13. Repeat every 10 years  Mammogram status: Completed 02/06/21. Repeat every year  Bone  Density status: Completed 03/05/21. Results reflect: Bone density results: OSTEOPENIA. Repeat every 2 years.  Additional Screening:  Hepatitis C Screening:  Completed 01/07/22  Vision Screening: Recommended annual ophthalmology exams for early detection of glaucoma and other disorders of the eye. Is the patient up to date with their annual eye exam?  Yes  Who is the provider or what is the name of the office in which the patient attends annual eye exams? [facility] on [address]  If pt is not established with a provider, would they like to be referred to a provider to establish care? No .   Dental Screening: Recommended annual dental exams for proper oral hygiene  Community Resource Referral / Chronic Care Management: CRR required this visit?  No   CCM required this visit?  No      Plan:     I have personally reviewed and noted the following in the patient's chart:   Medical and social history Use of alcohol, tobacco or illicit drugs  Current medications and supplements including opioid prescriptions. Patient is not currently taking opioid prescriptions. Functional ability and status Nutritional status Physical activity Advanced directives List of other physicians Hospitalizations, surgeries, and ER visits in previous 12 months Vitals Screenings to include cognitive, depression, and falls Referrals and appointments  In addition, I have reviewed and discussed with patient certain preventive protocols, quality metrics, and best practice recommendations. A written personalized care plan for preventive services as well as general preventive health recommendations were provided to patient.     Marzella Schlein, LPN   1/61/0960   Nurse Notes: none

## 2022-05-17 NOTE — Patient Instructions (Signed)
Tammy Ramos , Thank you for taking time to come for your Medicare Wellness Visit. I appreciate your ongoing commitment to your health goals. Please review the following plan we discussed and let me know if I can assist you in the future.   These are the goals we discussed:  Goals   None     This is a list of the screening recommended for you and due dates:  Health Maintenance  Topic Date Due   COVID-19 Vaccine (4 - 2023-24 season) 10/02/2021   Flu Shot  09/02/2022   Mammogram  02/07/2023   Medicare Annual Wellness Visit  05/17/2023   Colon Cancer Screening  08/28/2023   Pneumonia Vaccine  Completed   DEXA scan (bone density measurement)  Completed   Hepatitis C Screening: USPSTF Recommendation to screen - Ages 75-79 yo.  Completed   Zoster (Shingles) Vaccine  Completed   HPV Vaccine  Aged Out   DTaP/Tdap/Td vaccine  Discontinued    Advanced directives: Advance directive discussed with you today. Even though you declined this today please call our office should you change your mind and we can give you the proper paperwork for you to fill out.  Conditions/risks identified: none at this time   Next appointment: Follow up in one year for your annual wellness visit    Preventive Care 65 Years and Older, Female Preventive care refers to lifestyle choices and visits with your health care provider that can promote health and wellness. What does preventive care include? A yearly physical exam. This is also called an annual well check. Dental exams once or twice a year. Routine eye exams. Ask your health care provider how often you should have your eyes checked. Personal lifestyle choices, including: Daily care of your teeth and gums. Regular physical activity. Eating a healthy diet. Avoiding tobacco and drug use. Limiting alcohol use. Practicing safe sex. Taking low-dose aspirin every day. Taking vitamin and mineral supplements as recommended by your health care provider. What  happens during an annual well check? The services and screenings done by your health care provider during your annual well check will depend on your age, overall health, lifestyle risk factors, and family history of disease. Counseling  Your health care provider may ask you questions about your: Alcohol use. Tobacco use. Drug use. Emotional well-being. Home and relationship well-being. Sexual activity. Eating habits. History of falls. Memory and ability to understand (cognition). Work and work Astronomer. Reproductive health. Screening  You may have the following tests or measurements: Height, weight, and BMI. Blood pressure. Lipid and cholesterol levels. These may be checked every 5 years, or more frequently if you are over 55 years old. Skin check. Lung cancer screening. You may have this screening every year starting at age 69 if you have a 30-pack-year history of smoking and currently smoke or have quit within the past 15 years. Fecal occult blood test (FOBT) of the stool. You may have this test every year starting at age 21. Flexible sigmoidoscopy or colonoscopy. You may have a sigmoidoscopy every 5 years or a colonoscopy every 10 years starting at age 88. Hepatitis C blood test. Hepatitis B blood test. Sexually transmitted disease (STD) testing. Diabetes screening. This is done by checking your blood sugar (glucose) after you have not eaten for a while (fasting). You may have this done every 1-3 years. Bone density scan. This is done to screen for osteoporosis. You may have this done starting at age 36. Mammogram. This may be done every  1-2 years. Talk to your health care provider about how often you should have regular mammograms. Talk with your health care provider about your test results, treatment options, and if necessary, the need for more tests. Vaccines  Your health care provider may recommend certain vaccines, such as: Influenza vaccine. This is recommended every  year. Tetanus, diphtheria, and acellular pertussis (Tdap, Td) vaccine. You may need a Td booster every 10 years. Zoster vaccine. You may need this after age 76. Pneumococcal 13-valent conjugate (PCV13) vaccine. One dose is recommended after age 62. Pneumococcal polysaccharide (PPSV23) vaccine. One dose is recommended after age 2. Talk to your health care provider about which screenings and vaccines you need and how often you need them. This information is not intended to replace advice given to you by your health care provider. Make sure you discuss any questions you have with your health care provider. Document Released: 02/14/2015 Document Revised: 10/08/2015 Document Reviewed: 11/19/2014 Elsevier Interactive Patient Education  2017 [place] Prevention in the Home Falls can cause injuries. They can happen to people of all ages. There are many things you can do to make your home safe and to help prevent falls. What can I do on the outside of my home? Regularly fix the edges of walkways and driveways and fix any cracks. Remove anything that might make you trip as you walk through a door, such as a raised step or threshold. Trim any bushes or trees on the path to your home. Use bright outdoor lighting. Clear any walking paths of anything that might make someone trip, such as rocks or tools. Regularly check to see if handrails are loose or broken. Make sure that both sides of any steps have handrails. Any raised decks and porches should have guardrails on the edges. Have any leaves, snow, or ice cleared regularly. Use sand or salt on walking paths during winter. Clean up any spills in your garage right away. This includes oil or grease spills. What can I do in the bathroom? Use night lights. Install grab bars by the toilet and in the tub and shower. Do not use towel bars as grab bars. Use non-skid mats or decals in the tub or shower. If you need to sit down in the shower, use a  plastic, non-slip stool. Keep the floor dry. Clean up any water that spills on the floor as soon as it happens. Remove soap buildup in the tub or shower regularly. Attach bath mats securely with double-sided non-slip rug tape. Do not have throw rugs and other things on the floor that can make you trip. What can I do in the bedroom? Use night lights. Make sure that you have a light by your bed that is easy to reach. Do not use any sheets or blankets that are too big for your bed. They should not hang down onto the floor. Have a firm chair that has side arms. You can use this for support while you get dressed. Do not have throw rugs and other things on the floor that can make you trip. What can I do in the kitchen? Clean up any spills right away. Avoid walking on wet floors. Keep items that you use a lot in easy-to-reach places. If you need to reach something above you, use a strong step stool that has a grab bar. Keep electrical cords out of the way. Do not use floor polish or wax that makes floors slippery. If you must use wax, use non-skid  floor wax. Do not have throw rugs and other things on the floor that can make you trip. What can I do with my stairs? Do not leave any items on the stairs. Make sure that there are handrails on both sides of the stairs and use them. Fix handrails that are broken or loose. Make sure that handrails are as long as the stairways. Check any carpeting to make sure that it is firmly attached to the stairs. Fix any carpet that is loose or worn. Avoid having throw rugs at the top or bottom of the stairs. If you do have throw rugs, attach them to the floor with carpet tape. Make sure that you have a light switch at the top of the stairs and the bottom of the stairs. If you do not have them, ask someone to add them for you. What else can I do to help prevent falls? Wear shoes that: Do not have high heels. Have rubber bottoms. Are comfortable and fit you  well. Are closed at the toe. Do not wear sandals. If you use a stepladder: Make sure that it is fully opened. Do not climb a closed stepladder. Make sure that both sides of the stepladder are locked into place. Ask someone to hold it for you, if possible. Clearly mark and make sure that you can see: Any grab bars or handrails. First and last steps. Where the edge of each step is. Use tools that help you move around (mobility aids) if they are needed. These include: Canes. Walkers. Scooters. Crutches. Turn on the lights when you go into a dark area. Replace any light bulbs as soon as they burn out. Set up your furniture so you have a clear path. Avoid moving your furniture around. If any of your floors are uneven, fix them. If there are any pets around you, be aware of where they are. Review your medicines with your doctor. Some medicines can make you feel dizzy. This can increase your chance of falling. Ask your doctor what other things that you can do to help prevent falls. This information is not intended to replace advice given to you by your health care provider. Make sure you discuss any questions you have with your health care provider. Document Released: 11/14/2008 Document Revised: 06/26/2015 Document Reviewed: 02/22/2014 Elsevier Interactive Patient Education  2017 [employer].

## 2022-06-30 ENCOUNTER — Encounter: Payer: Self-pay | Admitting: Cardiology

## 2022-06-30 ENCOUNTER — Ambulatory Visit: Payer: Medicare Other | Attending: Cardiology | Admitting: Cardiology

## 2022-06-30 VITALS — BP 134/74 | HR 74 | Ht 61.0 in | Wt 136.4 lb

## 2022-06-30 DIAGNOSIS — R072 Precordial pain: Secondary | ICD-10-CM | POA: Diagnosis not present

## 2022-06-30 DIAGNOSIS — E782 Mixed hyperlipidemia: Secondary | ICD-10-CM

## 2022-06-30 DIAGNOSIS — R079 Chest pain, unspecified: Secondary | ICD-10-CM

## 2022-06-30 DIAGNOSIS — I2510 Atherosclerotic heart disease of native coronary artery without angina pectoris: Secondary | ICD-10-CM | POA: Diagnosis not present

## 2022-06-30 DIAGNOSIS — I10 Essential (primary) hypertension: Secondary | ICD-10-CM

## 2022-06-30 MED ORDER — EZETIMIBE 10 MG PO TABS: 10.0000 mg | ORAL_TABLET | Freq: Every day | ORAL | 3 refills | Status: DC

## 2022-06-30 MED ORDER — ATORVASTATIN CALCIUM 80 MG PO TABS: ORAL_TABLET | ORAL | 3 refills | Status: DC

## 2022-06-30 MED ORDER — METOPROLOL TARTRATE 100 MG PO TABS: 100.0000 mg | ORAL_TABLET | Freq: Once | ORAL | 0 refills | Status: DC

## 2022-06-30 NOTE — Patient Instructions (Addendum)
Medication Instructions:   START ZETIA - Take one tablet ( 10mg ) by mouth daily.  2. INCREASE - Atorvastatin - take one tablet ( 80mg ) by mouth daily.   *If you need a refill on your cardiac medications before your next appointment, please call your pharmacy*   Lab Work:  Your physician recommends that you return for lab work in: 2 months at the medical mall. You will need to be fasting.  No appt is needed. Hours are M-F 7AM- 6 PM.  If you have labs (blood work) drawn today and your tests are completely normal, you will receive your results only by: MyChart Message (if you have MyChart) OR A paper copy in the mail If you have any lab test that is abnormal or we need to change your treatment, we will call you to review the results.   Testing/Procedures:  Your physician has requested that you have an echocardiogram. Echocardiography is a painless test that uses sound waves to create images of your heart. It provides your doctor with information about the size and shape of your heart and how well your heart's chambers and valves are working. This procedure takes approximately one hour. There are no restrictions for this procedure. Please do NOT wear cologne, perfume, aftershave, or lotions (deodorant is allowed). Please arrive 15 minutes prior to your appointment time.    Your cardiac CT will be scheduled at:  [facility] [address] [place], [place] [address] [phone]  OR   [facility] [address] [place], [place] [address] [phone]   If scheduled at [facility] or [facility], please arrive 15 mins early for check-in and test prep.   Please follow these instructions carefully (unless otherwise directed):  Hold all erectile dysfunction medications at least 3 days (72 hrs) prior to test. (Ie viagra, cialis, sildenafil, tadalafil, etc) We will  administer nitroglycerin during this exam.   On the Night Before the Test: Be sure to Drink plenty of water. Do not consume any caffeinated/decaffeinated beverages or chocolate 12 hours prior to your test. Do not take any antihistamines 12 hours prior to your test.  On the Day of the Test: Drink plenty of water until 1 hour prior to the test. Do not eat any food 1 hour prior to test. You may take your regular medications prior to the test.  Take metoprolol (Lopressor) two hours prior to test. FEMALES- please wear underwire-free bra if available, avoid dresses & tight clothing      After the Test: Drink plenty of water. After receiving IV contrast, you may experience a mild flushed feeling. This is normal. On occasion, you may experience a mild rash up to 24 hours after the test. This is not dangerous. If this occurs, you can take Benadryl 25 mg and increase your fluid intake. If you experience trouble breathing, this can be serious. If it is severe call 911 IMMEDIATELY. If it is mild, please call our office. If you take any of these medications: Glipizide/Metformin, Avandament, Glucavance, please do not take 48 hours after completing test unless otherwise instructed.  We will call to schedule your test 2-4 weeks out understanding that some insurance companies will need an authorization prior to the service being performed.   For non-scheduling related questions, please contact the cardiac imaging nurse navigator should you have any questions/concerns: Rockwell Alexandria, Cardiac Imaging Nurse Navigator Larey Brick, Cardiac Imaging Nurse Navigator [facility] Direct Office Dial: [phone]   For scheduling needs, including cancellations and rescheduling, please call [place], [phone].   Follow-Up: At [facility], you and your health needs are our priority.  As part of our continuing mission to provide you with exceptional heart care, we have  created designated Provider Care Teams.  These Care Teams include your primary Cardiologist (physician) and Advanced Practice Providers (APPs -  Physician Assistants and Nurse Practitioners) who all work together to provide you with the care you need, when you need it.  We recommend signing up for the patient portal called "MyChart".  Sign up information is provided on this After Visit Summary.  MyChart is used to connect with patients for Virtual Visits (Telemedicine).  Patients are able to view lab/test results, encounter notes, upcoming appointments, etc.  Non-urgent messages can be sent to your provider as well.   To learn more about what you can do with MyChart, go to [email or web address].    Your next appointment:   3 month(s)  Provider:   You may see Debbe Odea, MD or one of the following Advanced Practice Providers on your designated Care Team:   Nicolasa Ducking, NP Eula Listen, PA-C Cadence Fransico Michael, PA-C Charlsie Quest, NP

## 2022-06-30 NOTE — Progress Notes (Signed)
Cardiology Office Note:    Date:  06/30/2022   ID:  Tammy Ramos, DOB 1950-10-09, MRN [id number]  PCP:  Ardith Dark, MD   [facility] Providers Cardiologist:  Debbe Odea, MD     Referring MD: Ardith Dark, MD   Chief Complaint  Patient presents with   New Patient (Initial Visit)    Patient being referred for Aortic atherosclerosis and coronary artery calcifications seen on recent CT.  Patient denies new or acute cardiac problems/concerns today.  Interpreter service Leonette Most [phone]) utilized for patient clinical check in   Tammy Ramos is a 72 y.o. female who is being seen today for the evaluation of coronary artery calcification at the request of Ardith Dark, MD.   History of Present Illness:    Tammy Ramos is a 72 y.o. female with a hx of coronary calcifications on chest CT, hyperlipidemia, hypertension, CVA 2018 who presents due to hyperlipidemia and coronary calcifications.  Has a history of lung nodule, chest CT obtained 04/12/2022 showed three-vessel coronary artery calcification.  She denies chest pain.  Has a history of significantly elevated cholesterol, started on Lipitor 40 mg, over 10 years now, recent cholesterol blood work 6 months ago still showed significantly elevated lipid panel with LDL 275, total cholesterol 395, triglycerides 435.  Had episode of chest tightness in the past, brother had two-vessel CABG in his 3s.  Parents died when she was very young, no known cardiac history.  Had a history of aphasia 6 years ago, diagnosed with a stroke.  Past Medical History:  Diagnosis Date   High cholesterol    Stroke [facility])     Past Surgical History:  Procedure Laterality Date   HEMORRHOID SURGERY      Current Medications: Current Meds  Medication Sig   amLODipine (NORVASC) 10 MG tablet Take 1 tablet (10 mg total) by mouth daily.   aspirin 81 MG chewable tablet Chew by mouth daily.   ezetimibe (ZETIA) 10 MG tablet Take 1 tablet  (10 mg total) by mouth daily.   metFORMIN (GLUCOPHAGE-XR) 750 MG 24 hr tablet TAKE 1 TABLET BY MOUTH EVERY DAY WITH BREAKFAST   metoprolol tartrate (LOPRESSOR) 100 MG tablet Take 1 tablet (100 mg total) by mouth once for 1 dose. TWO HOURS PRIOR TO CARDIAC CTA   pantoprazole (PROTONIX) 40 MG tablet TAKE 1 TABLET BY MOUTH EVERY DAY IN THE MORNING   [DISCONTINUED] atorvastatin (LIPITOR) 40 MG tablet TAKE 1 TABLET BY MOUTH EVERYDAY AT BEDTIME     Allergies:   Patient has no known allergies.   Social History   Socioeconomic History   Marital status: Married    Spouse name: Not on file   Number of children: 1   Years of education: Not on file   Highest education level: Not on file  Occupational History   Not on file  Tobacco Use   Smoking status: Never   Smokeless tobacco: Never  Vaping Use   Vaping Use: Never used  Substance and Sexual Activity   Alcohol use: Never   Drug use: Never   Sexual activity: Not on file  Other Topics Concern   Not on file  Social History Narrative   Not on file   Social Determinants of Health   Financial Resource Strain: Low Risk  (05/17/2022)   Overall Financial Resource Strain (CARDIA)    Difficulty of Paying Living Expenses: Not hard at all  Food Insecurity: No Food Insecurity (05/17/2022)   Hunger  Vital Sign    Worried About Programme researcher, broadcasting/film/video in the Last Year: Never true    Ran Out of Food in the Last Year: Never true  Transportation Needs: No Transportation Needs (05/17/2022)   PRAPARE - Administrator, Civil Service (Medical): No    Lack of Transportation (Non-Medical): No  Physical Activity: Insufficiently Active (05/17/2022)   Exercise Vital Sign    Days of Exercise per Week: 3 days    Minutes of Exercise per Session: 40 min  Stress: No Stress Concern Present (05/17/2022)   [employer] of Occupational Health - Occupational Stress Questionnaire    Feeling of Stress : Not at all  Social Connections: Moderately Isolated  (05/17/2022)   Social Connection and Isolation Panel [NHANES]    Frequency of Communication with Friends and Family: More than three times a week    Frequency of Social Gatherings with Friends and Family: More than three times a week    Attends Religious Services: Never    Database administrator or Organizations: No    Attends Engineer, structural: Never    Marital Status: Married     Family History: The patient's family history includes Heart disease in her brother; Heart failure in her brother. There is no history of Heart attack or Stroke.  ROS:   Please see the history of present illness.     All other systems reviewed and are negative.  EKGs/Labs/Other Studies Reviewed:    The following studies were reviewed today:   EKG:  EKG is  ordered today.  The ekg ordered today demonstrates normal sinus rhythm, heart rate 74  Recent Labs: 01/07/2022: ALT 23; BUN 12; Creatinine, Ser 0.83; Hemoglobin 15.1; Platelets 280.0; Potassium 4.5; Sodium 134; TSH 0.91  Recent Lipid Panel    Component Value Date/Time   CHOL 395 (H) 01/07/2022 1043   TRIG (H) 01/07/2022 1043    435.0 Triglyceride is over 400; calculations on Lipids are invalid.   HDL 53.90 01/07/2022 1043   CHOLHDL 7 01/07/2022 1043   VLDL 40.8 (H) 01/05/2021 1543   LDLCALC 115 (H) 01/07/2020 0834   LDLDIRECT 275.0 01/07/2022 1043     Risk Assessment/Calculations:             Physical Exam:    VS:  BP 134/74 (BP Location: Left Arm, Patient Position: Sitting, Cuff Size: Normal)   Pulse 74   Ht 5\' 1"  (1.549 m)   Wt 136 lb 6.4 oz (61.9 kg)   SpO2 97%   BMI 25.77 kg/m     Wt Readings from Last 3 Encounters:  06/30/22 136 lb 6.4 oz (61.9 kg)  05/17/22 135 lb 12.8 oz (61.6 kg)  04/16/22 138 lb 9.6 oz (62.9 kg)     GEN:  Well nourished, well developed in no acute distress HEENT: Normal NECK: No JVD; No carotid bruits CARDIAC: RRR, no murmurs, rubs, gallops RESPIRATORY:  Clear to auscultation without  rales, wheezing or rhonchi  ABDOMEN: Soft, non-tender, non-distended MUSCULOSKELETAL:  No edema; No deformity  SKIN: Warm and dry NEUROLOGIC:  Alert and oriented x 3 PSYCHIATRIC:  Normal affect   ASSESSMENT:    1. Precordial pain   2. Coronary artery disease involving native coronary artery of native heart, unspecified whether angina present   3. Mixed hyperlipidemia   4. Primary hypertension   5. Chest pain, unspecified type    PLAN:    In order of problems listed above:  Chest pain, history of hyperlipidemia,  hypertension, family history of CAD.  Get echo, get coronary CT. CAD, echo and coronary CT as above.  Increase Lipitor to 80, start Zetia, continue aspirin 81 mg daily. Hyperlipidemia, cholesterol not controlled.  Increase Lipitor to 80 mg, start Zetia 10 mg daily.  Repeat lipid panel in 8 weeks. Hypertension, BP controlled, continue Norvasc 10 mg daily.  Follow-up in 2 months.      Medication Adjustments/Labs and Tests Ordered: Current medicines are reviewed at length with the patient today.  Concerns regarding medicines are outlined above.  Orders Placed This Encounter  Procedures   CT CORONARY MORPH W/CTA COR W/SCORE W/CA W/CM &/OR WO/CM   EKG 12-Lead   ECHOCARDIOGRAM COMPLETE   Meds ordered this encounter  Medications   atorvastatin (LIPITOR) 80 MG tablet    Sig: TAKE 1 TABLET BY MOUTH EVERYDAY AT BEDTIME    Dispense:  90 tablet    Refill:  3   ezetimibe (ZETIA) 10 MG tablet    Sig: Take 1 tablet (10 mg total) by mouth daily.    Dispense:  90 tablet    Refill:  3   metoprolol tartrate (LOPRESSOR) 100 MG tablet    Sig: Take 1 tablet (100 mg total) by mouth once for 1 dose. TWO HOURS PRIOR TO CARDIAC CTA    Dispense:  1 tablet    Refill:  0    Patient Instructions  Medication Instructions:   START ZETIA - Take one tablet ( 10mg ) by mouth daily.  2. INCREASE - Atorvastatin - take one tablet ( 80mg ) by mouth daily.   *If you need a refill on your  cardiac medications before your next appointment, please call your pharmacy*   Lab Work:  Your physician recommends that you return for lab work in: 2 months at the medical mall. You will need to be fasting.  No appt is needed. Hours are M-F 7AM- 6 PM.  If you have labs (blood work) drawn today and your tests are completely normal, you will receive your results only by: MyChart Message (if you have MyChart) OR A paper copy in the mail If you have any lab test that is abnormal or we need to change your treatment, we will call you to review the results.   Testing/Procedures:  Your physician has requested that you have an echocardiogram. Echocardiography is a painless test that uses sound waves to create images of your heart. It provides your doctor with information about the size and shape of your heart and how well your heart's chambers and valves are working. This procedure takes approximately one hour. There are no restrictions for this procedure. Please do NOT wear cologne, perfume, aftershave, or lotions (deodorant is allowed). Please arrive 15 minutes prior to your appointment time.    Your cardiac CT will be scheduled at:  [facility] [address] [place], [place] [address] [phone]  OR   [facility] [address] [place], [place] [address] [phone]   If scheduled at [facility] or [facility], please arrive 15 mins early for check-in and test prep.   Please follow these instructions carefully (unless otherwise directed):  Hold all erectile dysfunction medications at least 3 days (72 hrs) prior to test. (Ie viagra, cialis, sildenafil, tadalafil, etc) We will administer nitroglycerin during this exam.   On the Night Before the Test: Be sure to Drink plenty of water. Do not consume any caffeinated/decaffeinated beverages or  chocolate 12 hours  prior to your test. Do not take any antihistamines 12 hours prior to your test.  On the Day of the Test: Drink plenty of water until 1 hour prior to the test. Do not eat any food 1 hour prior to test. You may take your regular medications prior to the test.  Take metoprolol (Lopressor) two hours prior to test. FEMALES- please wear underwire-free bra if available, avoid dresses & tight clothing      After the Test: Drink plenty of water. After receiving IV contrast, you may experience a mild flushed feeling. This is normal. On occasion, you may experience a mild rash up to 24 hours after the test. This is not dangerous. If this occurs, you can take Benadryl 25 mg and increase your fluid intake. If you experience trouble breathing, this can be serious. If it is severe call 911 IMMEDIATELY. If it is mild, please call our office. If you take any of these medications: Glipizide/Metformin, Avandament, Glucavance, please do not take 48 hours after completing test unless otherwise instructed.  We will call to schedule your test 2-4 weeks out understanding that some insurance companies will need an authorization prior to the service being performed.   For non-scheduling related questions, please contact the cardiac imaging nurse navigator should you have any questions/concerns: Rockwell Alexandria, Cardiac Imaging Nurse Navigator Larey Brick, Cardiac Imaging Nurse Navigator [facility] Direct Office Dial: [phone]   For scheduling needs, including cancellations and rescheduling, please call [place], [phone].   Follow-Up: At [facility], you and your health needs are our priority.  As part of our continuing mission to provide you with exceptional heart care, we have created designated Provider Care Teams.  These Care Teams include your primary Cardiologist (physician) and Advanced Practice Providers (APPs -  Physician Assistants and Nurse Practitioners)  who all work together to provide you with the care you need, when you need it.  We recommend signing up for the patient portal called "MyChart".  Sign up information is provided on this After Visit Summary.  MyChart is used to connect with patients for Virtual Visits (Telemedicine).  Patients are able to view lab/test results, encounter notes, upcoming appointments, etc.  Non-urgent messages can be sent to your provider as well.   To learn more about what you can do with MyChart, go to [email or web address].    Your next appointment:   3 month(s)  Provider:   You may see Debbe Odea, MD or one of the following Advanced Practice Providers on your designated Care Team:   Nicolasa Ducking, NP Eula Listen, PA-C Cadence Fransico Michael, PA-C Charlsie Quest, NP   Signed, Debbe Odea, MD  06/30/2022 11:29 AM    [facility]

## 2022-07-19 ENCOUNTER — Ambulatory Visit (INDEPENDENT_AMBULATORY_CARE_PROVIDER_SITE_OTHER): Payer: Medicare Other | Admitting: Family Medicine

## 2022-07-19 ENCOUNTER — Encounter: Payer: Self-pay | Admitting: Family Medicine

## 2022-07-19 VITALS — BP 135/60 | HR 67 | Temp 97.1°F | Ht 61.0 in | Wt 134.6 lb

## 2022-07-19 DIAGNOSIS — E785 Hyperlipidemia, unspecified: Secondary | ICD-10-CM

## 2022-07-19 DIAGNOSIS — I10 Essential (primary) hypertension: Secondary | ICD-10-CM | POA: Diagnosis not present

## 2022-07-19 DIAGNOSIS — R7303 Prediabetes: Secondary | ICD-10-CM

## 2022-07-19 LAB — POCT GLYCOSYLATED HEMOGLOBIN (HGB A1C): Hemoglobin A1C: 5.9 % — AB (ref 4.0–5.6)

## 2022-07-19 NOTE — Patient Instructions (Signed)
It was very nice to see you today!  Your blood sugar and blood pressure levels are at goal.  Please continue to work on diet and exercise.  No medication changes today.  Return in about 6 months (around 01/18/2023) for Annual Physical.   Take care, Dr Jimmey Ralph  PLEASE NOTE:  If you had any lab tests, please let us know if you have not heard back within a few days. You may see your results on mychart before we have a chance to review them but we will give you a call once they are reviewed by [place].   If we ordered any referrals today, please let us know if you have not heard from their office within the next week.   If you had any urgent prescriptions sent in today, please check with the pharmacy within an hour of our visit to make sure the prescription was transmitted appropriately.   Please try these tips to maintain a healthy lifestyle:  Eat at least 3 REAL meals and 1-2 snacks per day.  Aim for no more than 5 hours between eating.  If you eat breakfast, please do so within one hour of getting up.   Each meal should contain half fruits/vegetables, one quarter protein, and one quarter carbs (no bigger than a computer mouse)  Cut down on sweet beverages. This includes juice, soda, and sweet tea.   Drink at least 1 glass of water with each meal and aim for at least 8 glasses per day  Exercise at least 150 minutes every week.

## 2022-07-19 NOTE — Assessment & Plan Note (Signed)
A1c 5.9.  Congratulated patient on A1c reduction.  She is working on lifestyle modifications.  Will continue metformin 750 mg daily.  Recheck in 6 months at CPE.

## 2022-07-19 NOTE — Assessment & Plan Note (Signed)
Recently saw cardiology.  They increased her Lipitor to 80 mg daily.  Started Zetia 10 mg daily.  She is now tolerating both of these well.  They are planning on repeating lipid panel in a few months.

## 2022-07-19 NOTE — Assessment & Plan Note (Signed)
Blood pressure at goal today on amlodipine 10 mg daily. 

## 2022-07-19 NOTE — Progress Notes (Signed)
   Tammy Ramos is a 72 y.o. female who presents today for an office visit.  Assessment/Plan:  Chronic Problems Addressed Today: Prediabetes A1c 5.9.  Congratulated patient on A1c reduction.  She is working on lifestyle modifications.  Will continue metformin 750 mg daily.  Recheck in 6 months at CPE.  Essential hypertension Blood pressure at goal today on amlodipine 10 mg daily.  Dyslipidemia Recently saw cardiology.  They increased her Lipitor to 80 mg daily.  Started Zetia 10 mg daily.  She is now tolerating both of these well.  They are planning on repeating lipid panel in a few months.    Subjective:  HPI:  See A/P for status of chronic conditions.  She is here today for follow-up.  Seen 6 months ago.  Doing well at that time.  Since her last visit she has seen cardiology and pulmonology.  She is now on Zetia 10 mg daily and Lipitor 80 mg daily.  She has been doing well with this.        Objective:  Physical Exam: BP 135/60   Pulse 67   Temp (!) 97.1 F (36.2 C) (Temporal)   Ht 5\' 1"  (1.549 m)   Wt 134 lb 9.6 oz (61.1 kg)   SpO2 98%   BMI 25.43 kg/m   Gen: No acute distress, resting comfortably CV: Regular rate and rhythm with no murmurs appreciated Pulm: Normal work of breathing, clear to auscultation bilaterally with no crackles, wheezes, or rhonchi Neuro: Grossly normal, moves all extremities Psych: Normal affect and thought content      Sahid Borba M. Jimmey Ralph, MD 07/19/2022 8:27 AM

## 2022-08-18 ENCOUNTER — Ambulatory Visit: Payer: Medicare Other | Attending: Cardiology

## 2022-08-18 DIAGNOSIS — R072 Precordial pain: Secondary | ICD-10-CM

## 2022-08-18 LAB — ECHOCARDIOGRAM COMPLETE
AR max vel: 2.06 cm2
AV Area VTI: 2.05 cm2
AV Area mean vel: 2 cm2
AV Mean grad: 6 mmHg
AV Peak grad: 11.6 mmHg
Ao pk vel: 1.71 m/s
Area-P 1/2: 3.08 cm2
Calc EF: 53.5 %
S' Lateral: 3.4 cm
Single Plane A2C EF: 55.6 %
Single Plane A4C EF: 53 %

## 2022-08-19 ENCOUNTER — Encounter (HOSPITAL_COMMUNITY): Payer: Self-pay

## 2022-09-07 ENCOUNTER — Encounter (HOSPITAL_COMMUNITY): Payer: Self-pay

## 2022-09-09 ENCOUNTER — Ambulatory Visit
Admission: RE | Admit: 2022-09-09 | Discharge: 2022-09-09 | Disposition: A | Discharge status: Home / Self Care | Payer: Medicare Other | Source: Ambulatory Visit | Attending: Cardiology | Admitting: Cardiology

## 2022-09-09 DIAGNOSIS — R079 Chest pain, unspecified: Secondary | ICD-10-CM

## 2022-09-09 MED ORDER — IOHEXOL 350 MG/ML SOLN: 75.0000 mL | Freq: Once | INTRAVENOUS | Status: DC | PRN

## 2022-09-09 MED ORDER — SODIUM CHLORIDE 0.9 % IV BOLUS: 150.0000 mL | Freq: Once | INTRAVENOUS | Status: DC

## 2022-09-09 NOTE — Progress Notes (Signed)
Patient arrived for Cardiac CT with husband. Patient heart rate in the 80's and did not take pre medications stated did not know about medication and did not speak to anyone regarding the CT. Patient and husband requested to be rescheduled and is rescheduled for 09/20/22 at 1000 at [facility]. CT technologist notified nurse navigator to follow up with patient Patient and husband verbalized understanding.

## 2022-09-17 ENCOUNTER — Telehealth (HOSPITAL_COMMUNITY): Payer: Self-pay | Admitting: Emergency Medicine

## 2022-09-17 ENCOUNTER — Other Ambulatory Visit (HOSPITAL_COMMUNITY): Payer: Self-pay | Admitting: Emergency Medicine

## 2022-09-17 MED ORDER — METOPROLOL TARTRATE 100 MG PO TABS: 100.0000 mg | ORAL_TABLET | Freq: Once | ORAL | 0 refills | Status: DC

## 2022-09-17 NOTE — Telephone Encounter (Signed)
Unable to leave vm Sara Wallace RN Navigator Cardiac Imaging [facility] [phone] Office  [phone] Cell  

## 2022-09-17 NOTE — Telephone Encounter (Signed)
100mg  metoprolol sent to pharm  Rockwell Alexandria RN Navigator Cardiac Imaging [facility] [phone] Office  [phone] Cell

## 2022-09-20 ENCOUNTER — Ambulatory Visit
Admission: RE | Admit: 2022-09-20 | Discharge: 2022-09-20 | Disposition: A | Discharge status: Home / Self Care | Payer: Medicare Other | Source: Ambulatory Visit | Attending: Cardiology | Admitting: Cardiology

## 2022-09-20 DIAGNOSIS — I2510 Atherosclerotic heart disease of native coronary artery without angina pectoris: Secondary | ICD-10-CM | POA: Insufficient documentation

## 2022-09-20 DIAGNOSIS — I700 Atherosclerosis of aorta: Secondary | ICD-10-CM | POA: Insufficient documentation

## 2022-09-20 DIAGNOSIS — R079 Chest pain, unspecified: Secondary | ICD-10-CM | POA: Diagnosis not present

## 2022-09-20 MED ORDER — IOHEXOL 350 MG/ML SOLN
100.0000 mL | Freq: Once | INTRAVENOUS | Status: AC | PRN
  Administered 2022-09-20: 100 mL via INTRAVENOUS

## 2022-09-20 MED ORDER — NITROGLYCERIN 0.4 MG SL SUBL
0.8000 mg | SUBLINGUAL_TABLET | Freq: Once | SUBLINGUAL | Status: AC
  Administered 2022-09-20: 0.8 mg via SUBLINGUAL

## 2022-09-20 NOTE — Progress Notes (Signed)

## 2022-09-28 ENCOUNTER — Ambulatory Visit (HOSPITAL_BASED_OUTPATIENT_CLINIC_OR_DEPARTMENT_OTHER): Payer: Medicare Other | Admitting: Internal Medicine

## 2022-09-28 ENCOUNTER — Encounter (HOSPITAL_BASED_OUTPATIENT_CLINIC_OR_DEPARTMENT_OTHER): Payer: Self-pay | Admitting: Internal Medicine

## 2022-09-28 VITALS — BP 124/68 | HR 83 | Ht 61.0 in | Wt 135.6 lb

## 2022-09-28 DIAGNOSIS — E7849 Other hyperlipidemia: Secondary | ICD-10-CM | POA: Diagnosis not present

## 2022-09-28 DIAGNOSIS — I2510 Atherosclerotic heart disease of native coronary artery without angina pectoris: Secondary | ICD-10-CM

## 2022-09-28 DIAGNOSIS — E785 Hyperlipidemia, unspecified: Secondary | ICD-10-CM

## 2022-09-28 NOTE — Progress Notes (Signed)
LIPID CLINIC CONSULT NOTE  Chief Complaint:  Manage dyslipidemia  Primary Care Physician: Ardith Dark, MD  Primary Cardiologist:  Debbe Odea, MD  HPI:  Tammy Ramos is a 72 y.o. female who is being seen today for the evaluation of dyslipidemia at the request of Ardith Dark, MD. this is a pleasant 72 year old female, referred for evaluation management of dyslipidemia.  She is followed by Dr. Azucena Cecil in [place] and was seen recently back in May.  She had an abnormal coronary CT which showed mild nonobstructive coronary disease.  She has had issues with dyslipidemia including high triglycerides.  Her labs in December showed total cholesterol 395, triglycerides 435, HDL 53 and direct LDL of 275.  Her statin was then increased to 80 mg atorvastatin and ezetimibe 10 mg daily was added.  She has not had repeat labs since then.  These findings are certainly concerning for genetic dyslipidemia, possibly familial combined hyperlipidemia.  There is a strong family history of heart disease in her brother.  PMHx:  Past Medical History:  Diagnosis Date   High cholesterol    Stroke [facility])     Past Surgical History:  Procedure Laterality Date   HEMORRHOID SURGERY      FAMHx:  Family History  Problem Relation Age of Onset   Heart disease Brother    Heart failure Brother    Heart attack Neg Hx    Stroke Neg Hx     SOCHx:   reports that she has never smoked. She has never used smokeless tobacco. She reports that she does not drink alcohol and does not use drugs.  ALLERGIES:  No Known Allergies  ROS: Pertinent items noted in HPI and remainder of comprehensive ROS otherwise negative.  HOME MEDS: Current Outpatient Medications on File Prior to Visit  Medication Sig Dispense Refill   amLODipine (NORVASC) 10 MG tablet Take 1 tablet (10 mg total) by mouth daily. 90 tablet 3   aspirin 81 MG chewable tablet Chew by mouth daily.     atorvastatin (LIPITOR) 80 MG  tablet TAKE 1 TABLET BY MOUTH EVERYDAY AT BEDTIME 90 tablet 3   ezetimibe (ZETIA) 10 MG tablet Take 1 tablet (10 mg total) by mouth daily. 90 tablet 3   metFORMIN (GLUCOPHAGE-XR) 750 MG 24 hr tablet TAKE 1 TABLET BY MOUTH EVERY DAY WITH BREAKFAST 90 tablet 3   metoprolol tartrate (LOPRESSOR) 100 MG tablet Take 1 tablet (100 mg total) by mouth once for 1 dose. TWO HOURS PRIOR TO CARDIAC CTA 1 tablet 0   pantoprazole (PROTONIX) 40 MG tablet TAKE 1 TABLET BY MOUTH EVERY DAY IN THE MORNING 90 tablet 3   No current facility-administered medications on file prior to visit.    LABS/IMAGING: No results found for this or any previous visit (from the past 48 hour(s)). No results found.  LIPID PANEL:    Component Value Date/Time   CHOL 395 (H) 01/07/2022 1043   TRIG (H) 01/07/2022 1043    435.0 Triglyceride is over 400; calculations on Lipids are invalid.   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   Ms. Bramlet has probable full meal combined hyperlipidemia with LDL that was significantly elevated as well as high triglycerides.  She had recent increase in her statin and ezetimibe was added.  She has not had repeat labs since then.  I would advise repeat lab work including NMR and  LP(a).  She may likely need a PCSK9 inhibitor in addition to this therapy to try to target LDL to less than 70.  I will reach out to her when we get those results.  She has a follow-up with her cardiologist on September 4.  I will coordinate treatment with him.  Thanks for the kind referral.  Chrystie Nose, MD, [facility]  [place]  [facility]  Medical Director of the Advanced Lipid Disorders &  Cardiovascular Risk Reduction Clinic Diplomate of the American Board of Clinical Lipidology Attending Cardiologist  Direct Dial: [phone]  Fax: [phone]  Website:  [email or web address] 09/28/2022, 10:44 AM

## 2022-09-28 NOTE — Patient Instructions (Signed)
Medication Instructions:  Continue current medications  *If you need a refill on your cardiac medications before your next appointment, please call your pharmacy*   Lab Work: Fasting Lipids, NMR and Lp(a)  If you have labs (blood work) drawn today and your tests are completely normal, you will receive your results only by: MyChart Message (if you have MyChart) OR A paper copy in the mail If you have any lab test that is abnormal or we need to change your treatment, we will call you to review the results.   Testing/Procedures: None Ordered   Follow-Up: At [facility], you and your health needs are our priority.  As part of our continuing mission to provide you with exceptional heart care, we have created designated Provider Care Teams.  These Care Teams include your primary Cardiologist (physician) and Advanced Practice Providers (APPs -  Physician Assistants and Nurse Practitioners) who all work together to provide you with the care you need, when you need it.  We recommend signing up for the patient portal called "MyChart".  Sign up information is provided on this After Visit Summary.  MyChart is used to connect with patients for Virtual Visits (Telemedicine).  Patients are able to view lab/test results, encounter notes, upcoming appointments, etc.  Non-urgent messages can be sent to your provider as well.   To learn more about what you can do with MyChart, go to [email or web address].    Your next appointment:   As Needed

## 2022-09-28 NOTE — Addendum Note (Signed)
Addended by: Chrystie Nose on: 09/28/2022 10:49 AM   Modules accepted: Orders

## 2022-09-29 ENCOUNTER — Other Ambulatory Visit: Payer: Self-pay | Admitting: Family Medicine

## 2022-09-29 DIAGNOSIS — E782 Mixed hyperlipidemia: Secondary | ICD-10-CM | POA: Diagnosis not present

## 2022-09-29 DIAGNOSIS — E785 Hyperlipidemia, unspecified: Secondary | ICD-10-CM | POA: Diagnosis not present

## 2022-09-29 DIAGNOSIS — E7849 Other hyperlipidemia: Secondary | ICD-10-CM | POA: Diagnosis not present

## 2022-10-01 LAB — NMR, LIPOPROFILE
Cholesterol, Total: 338 mg/dL — ABNORMAL HIGH (ref 100–199)
HDL Particle Number: 32.9 umol/L (ref >=30.5)
HDL-C: 49 mg/dL (ref >39)
LDL Particle Number: 3484 nmol/L — ABNORMAL HIGH (ref <1000)
LDL Size: 20.9 nm (ref >20.5)
LDL-C (NIH Calc): 235 mg/dL — ABNORMAL HIGH (ref 0–99)
LP-IR Score: 91 — ABNORMAL HIGH (ref <=45)
Small LDL Particle Number: 1845 nmol/L — ABNORMAL HIGH (ref <=527)
Triglycerides: 260 mg/dL — ABNORMAL HIGH (ref 0–149)

## 2022-10-01 LAB — LIPOPROTEIN A (LPA): Lipoprotein (a): <8.4 nmol/L (ref <75.0)

## 2022-10-06 ENCOUNTER — Other Ambulatory Visit (HOSPITAL_COMMUNITY): Payer: Self-pay

## 2022-10-06 ENCOUNTER — Telehealth: Payer: Self-pay

## 2022-10-06 ENCOUNTER — Encounter: Payer: Self-pay | Admitting: Cardiology

## 2022-10-06 ENCOUNTER — Other Ambulatory Visit: Payer: Self-pay

## 2022-10-06 ENCOUNTER — Ambulatory Visit: Payer: Medicare Other | Attending: Cardiology | Admitting: Cardiology

## 2022-10-06 VITALS — BP 128/76 | HR 80 | Ht 61.0 in | Wt 135.2 lb

## 2022-10-06 DIAGNOSIS — I10 Essential (primary) hypertension: Secondary | ICD-10-CM

## 2022-10-06 DIAGNOSIS — E782 Mixed hyperlipidemia: Secondary | ICD-10-CM | POA: Diagnosis not present

## 2022-10-06 DIAGNOSIS — I2510 Atherosclerotic heart disease of native coronary artery without angina pectoris: Secondary | ICD-10-CM | POA: Diagnosis not present

## 2022-10-06 MED ORDER — REPATHA SURECLICK 140 MG/ML ~~LOC~~ SOAJ: 140.0000 mg | SUBCUTANEOUS | 2 refills | Status: DC

## 2022-10-06 NOTE — Progress Notes (Signed)
Cardiology Office Note:    Date:  10/06/2022   ID:  Tammy Ramos, DOB 1950/06/10, MRN [id number]  PCP:  Ardith Dark, MD   [facility] Providers Cardiologist:  Debbe Odea, MD     Referring MD: Ardith Dark, MD   Chief Complaint  Patient presents with   Follow-up    Discuss cardiac testing.  Patient denies new or acute cardiac problems/concerns today.     History of Present Illness:    Tammy Ramos is a 72 y.o. female with a hx of mild nonobstructive CAD, hyperlipidemia, hypertension, CVA 2018 who presents for follow-up.  Previously seen due to coronary calcifications and hyperlipidemia.  Lipitor increased to 80 mg daily, echo and coronary CTA obtained.  She denies chest pain or shortness of breath.  Had a repeat lipid panel showing significantly elevated triglycerides, total cholesterol.  Followed up with lipid clinic.  Prior notes/studies Coronary CT 7/24 mild nonobstructive CAD in LAD, left circumflex, RCA 25 to 49% Echo 7/24 EF 55 to 60% brother had two-vessel CABG in his 43s.  Parents died when she was very young, no known cardiac history.  Had a history of aphasia 6 years ago, diagnosed with a stroke.  Past Medical History:  Diagnosis Date   High cholesterol    Stroke [facility])     Past Surgical History:  Procedure Laterality Date   HEMORRHOID SURGERY      Current Medications: Current Meds  Medication Sig   amLODipine (NORVASC) 10 MG tablet TAKE 1 TABLET BY MOUTH EVERY DAY   aspirin 81 MG chewable tablet Chew by mouth daily.   atorvastatin (LIPITOR) 80 MG tablet TAKE 1 TABLET BY MOUTH EVERYDAY AT BEDTIME   Evolocumab (REPATHA SURECLICK) 140 MG/ML SOAJ Inject 140 mg into the skin every 14 (fourteen) days.   ezetimibe (ZETIA) 10 MG tablet Take 1 tablet (10 mg total) by mouth daily.   metFORMIN (GLUCOPHAGE-XR) 750 MG 24 hr tablet TAKE 1 TABLET BY MOUTH EVERY DAY WITH BREAKFAST   pantoprazole (PROTONIX) 40 MG tablet TAKE 1 TABLET BY MOUTH EVERY  DAY IN THE MORNING     Allergies:   Patient has no known allergies.   Social History   Socioeconomic History   Marital status: Married    Spouse name: Not on file   Number of children: 1   Years of education: Not on file   Highest education level: Not on file  Occupational History   Not on file  Tobacco Use   Smoking status: Never   Smokeless tobacco: Never  Vaping Use   Vaping status: Never Used  Substance and Sexual Activity   Alcohol use: Never   Drug use: Never   Sexual activity: Not on file  Other Topics Concern   Not on file  Social History Narrative   Not on file   Social Determinants of Health   Financial Resource Strain: Low Risk  (05/17/2022)   Overall Financial Resource Strain (CARDIA)    Difficulty of Paying Living Expenses: Not hard at all  Food Insecurity: No Food Insecurity (05/17/2022)   Hunger Vital Sign    Worried About Running Out of Food in the Last Year: Never true    Ran Out of Food in the Last Year: Never true  Transportation Needs: No Transportation Needs (05/17/2022)   PRAPARE - Administrator, Civil Service (Medical): No    Lack of Transportation (Non-Medical): No  Physical Activity: Insufficiently Active (05/17/2022)   Exercise  Vital Sign    Days of Exercise per Week: 3 days    Minutes of Exercise per Session: 40 min  Stress: No Stress Concern Present (05/17/2022)   [employer] of Occupational Health - Occupational Stress Questionnaire    Feeling of Stress : Not at all  Social Connections: Moderately Isolated (05/17/2022)   Social Connection and Isolation Panel [NHANES]    Frequency of Communication with Friends and Family: More than three times a week    Frequency of Social Gatherings with Friends and Family: More than three times a week    Attends Religious Services: Never    Database administrator or Organizations: No    Attends Engineer, structural: Never    Marital Status: Married     Family History: The  patient's family history includes Heart disease in her brother; Heart failure in her brother. There is no history of Heart attack or Stroke.  ROS:   Please see the history of present illness.     All other systems reviewed and are negative.  EKGs/Labs/Other Studies Reviewed:    The following studies were reviewed today:   EKG:  EKG not ordered today.  Recent Labs: 01/07/2022: ALT 23; BUN 12; Creatinine, Ser 0.83; Hemoglobin 15.1; Platelets 280.0; Potassium 4.5; Sodium 134; TSH 0.91  Recent Lipid Panel    Component Value Date/Time   CHOL 395 (H) 01/07/2022 1043   TRIG (H) 01/07/2022 1043    435.0 Triglyceride is over 400; calculations on Lipids are invalid.   HDL 53.90 01/07/2022 1043   CHOLHDL 7 01/07/2022 1043   VLDL 40.8 (H) 01/05/2021 1543   LDLCALC 115 (H) 01/07/2020 0834   LDLDIRECT 275.0 01/07/2022 1043     Risk Assessment/Calculations:             Physical Exam:    VS:  BP 128/76 (BP Location: Left Arm, Patient Position: Sitting, Cuff Size: Normal)   Pulse 80   Ht 5\' 1"  (1.549 m)   Wt 135 lb 3.2 oz (61.3 kg)   SpO2 98%   BMI 25.55 kg/m     Wt Readings from Last 3 Encounters:  10/06/22 135 lb 3.2 oz (61.3 kg)  09/28/22 135 lb 9.6 oz (61.5 kg)  07/19/22 134 lb 9.6 oz (61.1 kg)     GEN:  Well nourished, well developed in no acute distress HEENT: Normal NECK: No JVD; No carotid bruits CARDIAC: RRR, no murmurs, rubs, gallops RESPIRATORY:  Clear to auscultation without rales, wheezing or rhonchi  ABDOMEN: Soft, non-tender, non-distended MUSCULOSKELETAL:  No edema; No deformity  SKIN: Warm and dry NEUROLOGIC:  Alert and oriented x 3 PSYCHIATRIC:  Normal affect   ASSESSMENT:    1. Coronary artery disease involving native coronary artery of native heart, unspecified whether angina present   2. Mixed hyperlipidemia   3. Primary hypertension    PLAN:    In order of problems listed above:  Mild nonobstructive CAD (25-49%), involving LAD, left  circumflex, distal RCA.  EF 55 to 60%.  Continue aspirin 81 mg daily, Lipitor 80. Hyperlipidemia, cholesterol not controlled.  Lipitor previously increased.  Start PCSK9.  Continue Lipitor 80 mg daily, Zetia 10 mg daily.  Obtain fasting lipid profile in 3 months.  Keep appointment with lipid clinic Hypertension, BP controlled, continue Norvasc 10 mg daily.  Follow-up in 3 months.  Okay to move out appointment if follow-up is scheduled with lipid clinic.      Medication Adjustments/Labs and Tests Ordered: Current medicines are  reviewed at length with the patient today.  Concerns regarding medicines are outlined above.  Orders Placed This Encounter  Procedures   Lipid panel   Meds ordered this encounter  Medications   Evolocumab (REPATHA SURECLICK) 140 MG/ML SOAJ    Sig: Inject 140 mg into the skin every 14 (fourteen) days.    Dispense:  2 mL    Refill:  2    Patient Instructions  Medication Instructions:   1. START Repatha - Inject one Pen into skin every 14 days.  *If you need a refill on your cardiac medications before your next appointment, please call your pharmacy*   Lab Work:  Your provider would like for you to return in 3 months (on or around 01/05/2023 ) to have the following labs drawn: LIPID.   Please go to [facility] [address] ([facility]) [address], [place] [address] You do not need an appointment.  They are open from 7:30 am-4 pm.  Lunch from 1:00 pm- 2:00 pm You WILL need to be fasting.     If you have labs (blood work) drawn today and your tests are completely normal, you will receive your results only by: MyChart Message (if you have MyChart) OR A paper copy in the mail If you have any lab test that is abnormal or we need to change your treatment, we will call you to review the results.   Testing/Procedures:  None Ordered   Follow-Up: At [facility], you and your health needs are our priority.  As part of our  continuing mission to provide you with exceptional heart care, we have created designated Provider Care Teams.  These Care Teams include your primary Cardiologist (physician) and Advanced Practice Providers (APPs -  Physician Assistants and Nurse Practitioners) who all work together to provide you with the care you need, when you need it.  We recommend signing up for the patient portal called "MyChart".  Sign up information is provided on this After Visit Summary.  MyChart is used to connect with patients for Virtual Visits (Telemedicine).  Patients are able to view lab/test results, encounter notes, upcoming appointments, etc.  Non-urgent messages can be sent to your provider as well.   To learn more about what you can do with MyChart, go to [email or web address].    Your next appointment:   3 month(s)  Provider:   You may see Debbe Odea, MD or one of the following Advanced Practice Providers on your designated Care Team:   Nicolasa Ducking, NP Eula Listen, PA-C Cadence Fransico Michael, PA-C Charlsie Quest, NP   Signed, Debbe Odea, MD  10/06/2022 10:40 AM    [facility]

## 2022-10-06 NOTE — Telephone Encounter (Signed)
Pharmacy Patient Advocate Encounter   Received notification from CoverMyMeds that prior authorization for REPATHA is required/requested.   Insurance verification completed.   The patient is insured through [employer]  .   Per test claim: PA required; PA submitted to [facility] MEDICARE via CoverMyMeds Key/confirmation #/EOC [id number] Status is pending

## 2022-10-06 NOTE — Patient Instructions (Addendum)
Medication Instructions:   1. START Repatha - Inject one Pen into skin every 14 days.  *If you need a refill on your cardiac medications before your next appointment, please call your pharmacy*   Lab Work:  Your provider would like for you to return in 3 months (on or around 01/05/2023 ) to have the following labs drawn: LIPID.   Please go to [facility] [address] ([facility]) [address], [place] [address] You do not need an appointment.  They are open from 7:30 am-4 pm.  Lunch from 1:00 pm- 2:00 pm You WILL need to be fasting.     If you have labs (blood work) drawn today and your tests are completely normal, you will receive your results only by: MyChart Message (if you have MyChart) OR A paper copy in the mail If you have any lab test that is abnormal or we need to change your treatment, we will call you to review the results.   Testing/Procedures:  None Ordered   Follow-Up: At [facility], you and your health needs are our priority.  As part of our continuing mission to provide you with exceptional heart care, we have created designated Provider Care Teams.  These Care Teams include your primary Cardiologist (physician) and Advanced Practice Providers (APPs -  Physician Assistants and Nurse Practitioners) who all work together to provide you with the care you need, when you need it.  We recommend signing up for the patient portal called "MyChart".  Sign up information is provided on this After Visit Summary.  MyChart is used to connect with patients for Virtual Visits (Telemedicine).  Patients are able to view lab/test results, encounter notes, upcoming appointments, etc.  Non-urgent messages can be sent to your provider as well.   To learn more about what you can do with MyChart, go to [email or web address].    Your next appointment:   3 month(s)  Provider:   You may see Debbe Odea, MD or one of the following Advanced Practice  Providers on your designated Care Team:   Nicolasa Ducking, NP Eula Listen, PA-C Cadence Fransico Michael, PA-C Charlsie Quest, NP

## 2022-10-07 ENCOUNTER — Other Ambulatory Visit (HOSPITAL_COMMUNITY): Payer: Self-pay

## 2022-10-07 NOTE — Telephone Encounter (Signed)
Pharmacy Patient Advocate Encounter  Received notification from [facility] MEDICARE  that Prior Authorization for REPATHA has been APPROVED from 10/06/22 to 10/06/23.

## 2022-11-26 ENCOUNTER — Other Ambulatory Visit: Payer: Self-pay

## 2022-11-26 MED ORDER — REPATHA SURECLICK 140 MG/ML ~~LOC~~ SOAJ: 140.0000 mg | SUBCUTANEOUS | 0 refills | Status: DC

## 2023-01-05 ENCOUNTER — Ambulatory Visit: Payer: Medicare Other | Attending: Cardiology | Admitting: Cardiology

## 2023-01-05 ENCOUNTER — Encounter: Payer: Self-pay | Admitting: Cardiology

## 2023-01-05 VITALS — BP 110/66 | HR 73 | Ht 61.0 in | Wt 133.4 lb

## 2023-01-05 DIAGNOSIS — E782 Mixed hyperlipidemia: Secondary | ICD-10-CM | POA: Diagnosis not present

## 2023-01-05 DIAGNOSIS — I2510 Atherosclerotic heart disease of native coronary artery without angina pectoris: Secondary | ICD-10-CM | POA: Diagnosis not present

## 2023-01-05 DIAGNOSIS — I10 Essential (primary) hypertension: Secondary | ICD-10-CM | POA: Diagnosis not present

## 2023-01-05 NOTE — Progress Notes (Signed)
Cardiology Office Note:    Date:  01/05/2023   ID:  Tammy Ramos, DOB Jun 09, 1950, MRN [id number]  PCP:  Ardith Dark, MD   [facility] Providers Cardiologist:  Debbe Odea, MD     Referring MD: Ardith Dark, MD   Chief Complaint  Patient presents with   Follow-up    Patient denies new or acute cardiac problems/concerns today.  Patient has started Repatha with no problems.   History of Present Illness:    Tammy Ramos is a 72 y.o. female with a hx of mild nonobstructive CAD, hyperlipidemia, hypertension, CVA 2018 who presents for follow-up.  Previously started on Repatha, tolerating Repatha with no adverse effects.  Also compliant with Zetia and Lipitor as prescribed.  Feels well, has no concerns at this time.  BP adequately controlled at home.   Prior notes/studies Coronary CT 7/24 mild nonobstructive CAD in LAD, left circumflex, RCA 25 to 49% Echo 7/24 EF 55 to 60% brother had two-vessel CABG in his 54s.  Parents died when she was very young, no known cardiac history.  Had a history of aphasia 6 years ago, diagnosed with a stroke.  Past Medical History:  Diagnosis Date   High cholesterol    Stroke [facility])     Past Surgical History:  Procedure Laterality Date   HEMORRHOID SURGERY      Current Medications: Current Meds  Medication Sig   amLODipine (NORVASC) 10 MG tablet TAKE 1 TABLET BY MOUTH EVERY DAY   aspirin 81 MG chewable tablet Chew by mouth daily.   atorvastatin (LIPITOR) 80 MG tablet TAKE 1 TABLET BY MOUTH EVERYDAY AT BEDTIME   Evolocumab (REPATHA SURECLICK) 140 MG/ML SOAJ Inject 140 mg into the skin every 14 (fourteen) days.   ezetimibe (ZETIA) 10 MG tablet Take 1 tablet (10 mg total) by mouth daily.   metFORMIN (GLUCOPHAGE-XR) 750 MG 24 hr tablet TAKE 1 TABLET BY MOUTH EVERY DAY WITH BREAKFAST     Allergies:   Patient has no known allergies.   Social History   Socioeconomic History   Marital status: Married    Spouse name: Not  on file   Number of children: 1   Years of education: Not on file   Highest education level: Not on file  Occupational History   Not on file  Tobacco Use   Smoking status: Never   Smokeless tobacco: Never  Vaping Use   Vaping status: Never Used  Substance and Sexual Activity   Alcohol use: Never   Drug use: Never   Sexual activity: Not on file  Other Topics Concern   Not on file  Social History Narrative   Not on file   Social Determinants of Health   Financial Resource Strain: Low Risk  (05/17/2022)   Overall Financial Resource Strain (CARDIA)    Difficulty of Paying Living Expenses: Not hard at all  Food Insecurity: No Food Insecurity (05/17/2022)   Hunger Vital Sign    Worried About Running Out of Food in the Last Year: Never true    Ran Out of Food in the Last Year: Never true  Transportation Needs: No Transportation Needs (05/17/2022)   PRAPARE - Administrator, Civil Service (Medical): No    Lack of Transportation (Non-Medical): No  Physical Activity: Insufficiently Active (05/17/2022)   Exercise Vital Sign    Days of Exercise per Week: 3 days    Minutes of Exercise per Session: 40 min  Stress: No Stress Concern  Present (05/17/2022)   [employer] of Occupational Health - Occupational Stress Questionnaire    Feeling of Stress : Not at all  Social Connections: Moderately Isolated (05/17/2022)   Social Connection and Isolation Panel [NHANES]    Frequency of Communication with Friends and Family: More than three times a week    Frequency of Social Gatherings with Friends and Family: More than three times a week    Attends Religious Services: Never    Database administrator or Organizations: No    Attends Engineer, structural: Never    Marital Status: Married     Family History: The patient's family history includes Heart disease in her brother; Heart failure in her brother. There is no history of Heart attack or Stroke.  ROS:   Please see  the history of present illness.     All other systems reviewed and are negative.  EKGs/Labs/Other Studies Reviewed:    The following studies were reviewed today:   EKG Interpretation Date/Time:  Wednesday January 05 2023 11:43:05 EST Ventricular Rate:  73 PR Interval:  186 QRS Duration:  84 QT Interval:  384 QTC Calculation: 423 R Axis:   -26  Text Interpretation: Normal sinus rhythm Nonspecific T wave abnormality Confirmed by Debbe Odea ([id number]) on 01/05/2023 12:12:39 PM    Recent Labs: 01/07/2022: ALT 23; BUN 12; Creatinine, Ser 0.83; Hemoglobin 15.1; Platelets 280.0; Potassium 4.5; Sodium 134; TSH 0.91  Recent Lipid Panel    Component Value Date/Time   CHOL 395 (H) 01/07/2022 1043   TRIG (H) 01/07/2022 1043    435.0 Triglyceride is over 400; calculations on Lipids are invalid.   HDL 53.90 01/07/2022 1043   CHOLHDL 7 01/07/2022 1043   VLDL 40.8 (H) 01/05/2021 1543   LDLCALC 115 (H) 01/07/2020 0834   LDLDIRECT 275.0 01/07/2022 1043     Risk Assessment/Calculations:             Physical Exam:    VS:  BP 110/66 (BP Location: Left Arm, Patient Position: Sitting, Cuff Size: Normal)   Pulse 73   Ht 5\' 1"  (1.549 m)   Wt 133 lb 6.4 oz (60.5 kg)   SpO2 98%   BMI 25.21 kg/m     Wt Readings from Last 3 Encounters:  01/05/23 133 lb 6.4 oz (60.5 kg)  10/06/22 135 lb 3.2 oz (61.3 kg)  09/28/22 135 lb 9.6 oz (61.5 kg)     GEN:  Well nourished, well developed in no acute distress HEENT: Normal NECK: No JVD; No carotid bruits CARDIAC: RRR, no murmurs, rubs, gallops RESPIRATORY:  Clear to auscultation without rales, wheezing or rhonchi  ABDOMEN: Soft, non-tender, non-distended MUSCULOSKELETAL:  No edema; No deformity  SKIN: Warm and dry NEUROLOGIC:  Alert and oriented x 3 PSYCHIATRIC:  Normal affect   ASSESSMENT:    1. Coronary artery disease involving native coronary artery of native heart, unspecified whether angina present   2. Mixed hyperlipidemia    3. Primary hypertension    PLAN:    In order of problems listed above:  Mild nonobstructive CAD (25-49%), involving LAD, left circumflex, distal RCA.  EF 55 to 60%.  Continue aspirin 81 mg daily, Lipitor 80. Hyperlipidemia, continue Repatha, Lipitor 80 mg daily, Zetia 10 mg daily.  Obtain fasting lipid profile in 3 months. Hypertension, BP controlled, continue Norvasc 10 mg daily.  Follow-up in 6 months.        Medication Adjustments/Labs and Tests Ordered: Current medicines are reviewed at length with the  patient today.  Concerns regarding medicines are outlined above.  Orders Placed This Encounter  Procedures   Lipid panel   EKG 12-Lead   No orders of the defined types were placed in this encounter.   Patient Instructions  Medication Instructions:  Your Physician recommend you continue on your current medication as directed.    *If you need a refill on your cardiac medications before your next appointment, please call your pharmacy*   Lab Work: Your provider would like for you to return in 3 months to have the following labs drawn: Lipid Panel.   Please go to [facility] [address] ([facility]) [address], [place] [address] You do not need an appointment.  They are open from 7:30 am-4 pm.  Lunch from 1:00 pm- 2:00 pm You will need to be fasting.       If you have labs (blood work) drawn today and your tests are completely normal, you will receive your results only by: MyChart Message (if you have MyChart) OR A paper copy in the mail If you have any lab test that is abnormal or we need to change your treatment, we will call you to review the results.   Testing/Procedures: None ordered   Follow-Up: At [facility], you and your health needs are our priority.  As part of our continuing mission to provide you with exceptional heart care, we have created designated Provider Care Teams.  These Care Teams include your primary  Cardiologist (physician) and Advanced Practice Providers (APPs -  Physician Assistants and Nurse Practitioners) who all work together to provide you with the care you need, when you need it.  We recommend signing up for the patient portal called "MyChart".  Sign up information is provided on this After Visit Summary.  MyChart is used to connect with patients for Virtual Visits (Telemedicine).  Patients are able to view lab/test results, encounter notes, upcoming appointments, etc.  Non-urgent messages can be sent to your provider as well.   To learn more about what you can do with MyChart, go to [email or web address].    Your next appointment:   3 month(s) ( After your Lipid Panel)  Provider:   You may see Debbe Odea, MD or one of the following Advanced Practice Providers on your designated Care Team:   Nicolasa Ducking, NP Eula Listen, PA-C Cadence Fransico Michael, PA-C Charlsie Quest, NP Carlos Levering, NP        Signed, Debbe Odea, MD  01/05/2023 12:40 PM    [facility]

## 2023-01-05 NOTE — Patient Instructions (Signed)
Medication Instructions:  Your Physician recommend you continue on your current medication as directed.    *If you need a refill on your cardiac medications before your next appointment, please call your pharmacy*   Lab Work: Your provider would like for you to return in 3 months to have the following labs drawn: Lipid Panel.   Please go to [facility] [address] ([facility] [address]) [address], [place] [address] You do not need an appointment.  They are open from 7:30 am-4 pm.  Lunch from 1:00 pm- 2:00 pm You will need to be fasting.       If you have labs (blood work) drawn today and your tests are completely normal, you will receive your results only by: MyChart Message (if you have MyChart) OR A paper copy in the mail If you have any lab test that is abnormal or we need to change your treatment, we will call you to review the results.   Testing/Procedures: None ordered   Follow-Up: At [facility], you and your health needs are our priority.  As part of our continuing mission to provide you with exceptional heart care, we have created designated Provider Care Teams.  These Care Teams include your primary Cardiologist (physician) and Advanced Practice Providers (APPs -  Physician Assistants and Nurse Practitioners) who all work together to provide you with the care you need, when you need it.  We recommend signing up for the patient portal called "MyChart".  Sign up information is provided on this After Visit Summary.  MyChart is used to connect with patients for Virtual Visits (Telemedicine).  Patients are able to view lab/test results, encounter notes, upcoming appointments, etc.  Non-urgent messages can be sent to your provider as well.   To learn more about what you can do with MyChart, go to [email or web address].    Your next appointment:   3 month(s) ( After your Lipid Panel)  Provider:   You may see Debbe Odea, MD or one of the  following Advanced Practice Providers on your designated Care Team:   Nicolasa Ducking, NP Eula Listen, PA-C Cadence Fransico Michael, PA-C Charlsie Quest, NP Carlos Levering, NP

## 2023-01-20 ENCOUNTER — Other Ambulatory Visit: Payer: Self-pay | Admitting: Family Medicine

## 2023-01-20 ENCOUNTER — Ambulatory Visit: Payer: Medicare Other | Admitting: Family Medicine

## 2023-01-20 VITALS — BP 120/70 | HR 71 | Temp 97.5°F | Ht 61.0 in | Wt 132.8 lb

## 2023-01-20 DIAGNOSIS — R7303 Prediabetes: Secondary | ICD-10-CM

## 2023-01-20 DIAGNOSIS — I10 Essential (primary) hypertension: Secondary | ICD-10-CM

## 2023-01-20 DIAGNOSIS — R928 Other abnormal and inconclusive findings on diagnostic imaging of breast: Secondary | ICD-10-CM

## 2023-01-20 DIAGNOSIS — R911 Solitary pulmonary nodule: Secondary | ICD-10-CM

## 2023-01-20 DIAGNOSIS — Z0001 Encounter for general adult medical examination with abnormal findings: Secondary | ICD-10-CM

## 2023-01-20 DIAGNOSIS — E785 Hyperlipidemia, unspecified: Secondary | ICD-10-CM

## 2023-01-20 LAB — COMPREHENSIVE METABOLIC PANEL
ALT: 54 U/L — ABNORMAL HIGH (ref 0–35)
AST: 33 U/L (ref 0–37)
Albumin: 4.9 g/dL (ref 3.5–5.2)
Alkaline Phosphatase: 73 U/L (ref 39–117)
BUN: 14 mg/dL (ref 6–23)
CO2: 27 meq/L (ref 19–32)
Calcium: 9.8 mg/dL (ref 8.4–10.5)
Chloride: 101 meq/L (ref 96–112)
Creatinine, Ser: 0.79 mg/dL (ref 0.40–1.20)
GFR: 75.94 mL/min (ref >60.00)
Glucose, Bld: 115 mg/dL — ABNORMAL HIGH (ref 70–99)
Potassium: 4 meq/L (ref 3.5–5.1)
Sodium: 139 meq/L (ref 135–145)
Total Bilirubin: 0.6 mg/dL (ref 0.2–1.2)
Total Protein: 8 g/dL (ref 6.0–8.3)

## 2023-01-20 LAB — CBC
HCT: 39.1 % (ref 36.0–46.0)
Hemoglobin: 13.5 g/dL (ref 12.0–15.0)
MCHC: 34.6 g/dL (ref 30.0–36.0)
MCV: 89.1 fL (ref 78.0–100.0)
Platelets: 278 10*3/uL (ref 150.0–400.0)
RBC: 4.39 Mil/uL (ref 3.87–5.11)
RDW: 13 % (ref 11.5–15.5)
WBC: 5.7 10*3/uL (ref 4.0–10.5)

## 2023-01-20 LAB — HEMOGLOBIN A1C: Hgb A1c MFr Bld: 6.1 % (ref 4.6–6.5)

## 2023-01-20 LAB — TSH: TSH: 1.27 u[IU]/mL (ref 0.35–5.50)

## 2023-01-20 NOTE — Patient Instructions (Addendum)
It was very nice to see you today!  We will check blood work today.  Please continue to work on diet and exercise.  We will order your mammogram today.  You are due for colon cancer screening next year.  Return in about 6 months (around 07/21/2023) for Follow Up.   Take care, Dr Jimmey Ralph  PLEASE NOTE:  If you had any lab tests, please let us know if you have not heard back within a few days. You may see your results on mychart before we have a chance to review them but we will give you a call once they are reviewed by [place].   If we ordered any referrals today, please let us know if you have not heard from their office within the next week.   If you had any urgent prescriptions sent in today, please check with the pharmacy within an hour of our visit to make sure the prescription was transmitted appropriately.   Please try these tips to maintain a healthy lifestyle:  Eat at least 3 REAL meals and 1-2 snacks per day.  Aim for no more than 5 hours between eating.  If you eat breakfast, please do so within one hour of getting up.   Each meal should contain half fruits/vegetables, one quarter protein, and one quarter carbs (no bigger than a computer mouse)  Cut down on sweet beverages. This includes juice, soda, and sweet tea.   Drink at least 1 glass of water with each meal and aim for at least 8 glasses per day  Exercise at least 150 minutes every week.    Preventive Care 57 Years and Older, Female Preventive care refers to lifestyle choices and visits with your health care provider that can promote health and wellness. Preventive care visits are also called wellness exams. What can I expect for my preventive care visit? Counseling Your health care provider may ask you questions about your: Medical history, including: Past medical problems. Family medical history. Pregnancy and menstrual history. History of falls. Current health, including: Memory and ability to understand  (cognition). Emotional well-being. Home life and relationship well-being. Sexual activity and sexual health. Lifestyle, including: Alcohol, nicotine or tobacco, and drug use. Access to firearms. Diet, exercise, and sleep habits. Work and work Astronomer. Sunscreen use. Safety issues such as seatbelt and bike helmet use. Physical exam Your health care provider will check your: Height and weight. These may be used to calculate your BMI (body mass index). BMI is a measurement that tells if you are at a healthy weight. Waist circumference. This measures the distance around your waistline. This measurement also tells if you are at a healthy weight and may help predict your risk of certain diseases, such as type 2 diabetes and high blood pressure. Heart rate and blood pressure. Body temperature. Skin for abnormal spots. What immunizations do I need?  Vaccines are usually given at various ages, according to a schedule. Your health care provider will recommend vaccines for you based on your age, medical history, and lifestyle or other factors, such as travel or where you work. What tests do I need? Screening Your health care provider may recommend screening tests for certain conditions. This may include: Lipid and cholesterol levels. Hepatitis C test. Hepatitis B test. HIV (human immunodeficiency virus) test. STI (sexually transmitted infection) testing, if you are at risk. Lung cancer screening. Colorectal cancer screening. Diabetes screening. This is done by checking your blood sugar (glucose) after you have not eaten for a while (  fasting). Mammogram. Talk with your health care provider about how often you should have regular mammograms. BRCA-related cancer screening. This may be done if you have a family history of breast, ovarian, tubal, or peritoneal cancers. Bone density scan. This is done to screen for osteoporosis. Talk with your health care provider about your test results,  treatment options, and if necessary, the need for more tests. Follow these instructions at home: Eating and drinking  Eat a diet that includes fresh fruits and vegetables, whole grains, lean protein, and low-fat dairy products. Limit your intake of foods with high amounts of sugar, saturated fats, and salt. Take vitamin and mineral supplements as recommended by your health care provider. Do not drink alcohol if your health care provider tells you not to drink. If you drink alcohol: Limit how much you have to 0-1 drink a day. Know how much alcohol is in your drink. In the [place] one drink equals one 12 oz bottle of beer (355 mL), one 5 oz glass of wine (148 mL), or one 1 oz glass of hard liquor (44 mL). Lifestyle Brush your teeth every morning and night with fluoride toothpaste. Floss one time each day. Exercise for at least 30 minutes 5 or more days each week. Do not use any products that contain nicotine or tobacco. These products include cigarettes, chewing tobacco, and vaping devices, such as e-cigarettes. If you need help quitting, ask your health care provider. Do not use drugs. If you are sexually active, practice safe sex. Use a condom or other form of protection in order to prevent STIs. Take aspirin only as told by your health care provider. Make sure that you understand how much to take and what form to take. Work with your health care provider to find out whether it is safe and beneficial for you to take aspirin daily. Ask your health care provider if you need to take a cholesterol-lowering medicine (statin). Find healthy ways to manage stress, such as: Meditation, yoga, or listening to music. Journaling. Talking to a trusted person. Spending time with friends and family. Minimize exposure to UV radiation to reduce your risk of skin cancer. Safety Always wear your seat belt while driving or riding in a vehicle. Do not drive: If you have been drinking alcohol. Do not ride with  someone who has been drinking. When you are tired or distracted. While texting. If you have been using any mind-altering substances or drugs. Wear a helmet and other protective equipment during sports activities. If you have firearms in your house, make sure you follow all gun safety procedures. What's next? Visit your health care provider once a year for an annual wellness visit. Ask your health care provider how often you should have your eyes and teeth checked. Stay up to date on all vaccines. This information is not intended to replace advice given to you by your health care provider. Make sure you discuss any questions you have with your health care provider. Document Revised: 07/16/2020 Document Reviewed: 07/16/2020 Elsevier Patient Education  2024 Elsevier Inc.  Mediterranean Diet A Mediterranean diet is based on the traditions of countries on the [employer]. It focuses on eating more: Fruits and vegetables. Whole grains, beans, nuts, and seeds. Heart-healthy fats. These are fats that are good for your heart. It involves eating less: Dairy. Meat and eggs. Processed foods with added sugar, salt, and fat. This type of diet can help prevent certain conditions. It can also improve outcomes if you have a long-term (  chronic) disease, such as kidney or heart disease. What are tips for following this plan? Reading food labels Check packaged foods for: The serving size. For foods such as rice and pasta, the serving size is the amount of cooked product, not dry. The total fat. Avoid foods with saturated fat or trans fat. Added sugars, such as corn syrup. Shopping  Try to have a balanced diet. Buy a variety of foods, such as: Fresh fruits and vegetables. You may be able to get these from local farmers markets. You can also buy them frozen. Grains, beans, nuts, and seeds. Some of these can be bought in bulk. Fresh seafood. Poultry and eggs. Low-fat dairy products. Buy whole  ingredients instead of foods that have already been packaged. If you can't get fresh seafood, buy precooked frozen shrimp or canned fish, such as tuna, salmon, or sardines. Stock your pantry so you always have certain foods on hand, such as olive oil, canned tuna, canned tomatoes, rice, pasta, and beans. Cooking Cook foods with extra-virgin olive oil instead of using butter or other vegetable oils. Have meat as a side dish. Have vegetables or grains as your main dish. This means having meat in small portions or adding small amounts of meat to foods like pasta or stew. Use beans or vegetables instead of meat in common dishes like chili or lasagna. Try out different cooking methods. Try roasting, broiling, steaming, and sauting vegetables. Add frozen vegetables to soups, stews, pasta, or rice. Add nuts or seeds for added healthy fats and plant protein at each meal. You can add these to yogurt, salads, or vegetable dishes. Marinate fish or vegetables using olive oil, lemon juice, garlic, and fresh herbs. Meal planning Plan to eat a vegetarian meal one day each week. Try to work up to two vegetarian meals, if possible. Eat seafood two or more times a week. Have healthy snacks on hand. These may include: Vegetable sticks with hummus. Greek yogurt. Fruit and nut trail mix. Eat balanced meals. These should include: Fruit: 2-3 servings a day. Vegetables: 4-5 servings a day. Low-fat dairy: 2 servings a day. Fish, poultry, or lean meat: 1 serving a day. Beans and legumes: 2 or more servings a week. Nuts and seeds: 1-2 servings a day. Whole grains: 6-8 servings a day. Extra-virgin olive oil: 3-4 servings a day. Limit red meat and sweets to just a few servings a month. Lifestyle  Try to cook and eat meals with your family. Drink enough fluid to keep your pee (urine) pale yellow. Be active every day. This includes: Aerobic exercise, which is exercise that causes your heart to beat faster.  Examples include running and swimming. Leisure activities like gardening, walking, or housework. Get 7-8 hours of sleep each night. Drink red wine if your provider says you can. A glass of wine is 5 oz (150 mL). You may be allowed to have: Up to 1 glass a day if you're female and not pregnant. Up to 2 glasses a day if you're female. What foods should I eat? Fruits Apples. Apricots. Avocado. Berries. Bananas. Cherries. Dates. Figs. Grapes. Lemons. Melon. Oranges. Peaches. Plums. Pomegranate. Vegetables Artichokes. Beets. Broccoli. Cabbage. Carrots. Eggplant. Green beans. Chard. Kale. Spinach. Onions. Leeks. Peas. Squash. Tomatoes. Peppers. Radishes. Grains Whole-grain pasta. Brown rice. Bulgur wheat. Polenta. Couscous. Whole-wheat bread. Orpah Cobb. Meats and other proteins Beans. Almonds. Sunflower seeds. Pine nuts. Peanuts. Cod. Salmon. Scallops. Shrimp. Tuna. Tilapia. Clams. Oysters. Eggs. Chicken or [place] without skin. Dairy Low-fat milk. Cheese. Greek yogurt.  Fats and oils Extra-virgin olive oil. Avocado oil. Grapeseed oil. Beverages Water. Red wine. Herbal tea. Sweets and desserts Greek yogurt with honey. Baked apples. Poached pears. Trail mix. Seasonings and condiments Basil. Cilantro. Coriander. Cumin. Mint. Parsley. Sage. Rosemary. Tarragon. Garlic. Oregano. Thyme. Pepper. Balsamic vinegar. Tahini. Hummus. Tomato sauce. Olives. Mushrooms. The items listed above may not be all the foods and drinks you can have. Talk to a dietitian to learn more. What foods should I limit? This is a list of foods that should be eaten rarely. Fruits Fruit canned in syrup. Vegetables Deep-fried potatoes, like [place] fries. Grains Packaged pasta or rice dishes. Cereal with added sugar. Snacks with added sugar. Meats and other proteins Beef. Pork. Lamb. Chicken or [place] with skin. Hot dogs. Tomasa Blase. Dairy Ice cream. Sour cream. Whole milk. Fats and oils Butter. Canola oil. Vegetable oil.  Beef fat (tallow). Lard. Beverages Juice. Sugar-sweetened soft drinks. Beer. Liquor and spirits. Sweets and desserts Cookies. Cakes. Pies. Candy. Seasonings and condiments Mayonnaise. Pre-made sauces and marinades. The items listed above may not be all the foods and drinks you should limit. Talk to a dietitian to learn more. Where to find more information American Heart Association (AHA): heart.org This information is not intended to replace advice given to you by your health care provider. Make sure you discuss any questions you have with your health care provider. Document Revised: 05/02/2022 Document Reviewed: 05/02/2022 Elsevier Patient Education  2024 [employer].

## 2023-01-20 NOTE — Assessment & Plan Note (Signed)
Blood pressure at goal on amlodipine 10 mg daily.

## 2023-01-20 NOTE — Progress Notes (Signed)
Chief Complaint:  Tammy Ramos is a 72 y.o. female who presents today for her annual comprehensive physical exam.    Assessment/Plan:  Chronic Problems Addressed Today: Essential hypertension Blood pressure at goal on amlodipine 10 mg daily.  Prediabetes Check A1c.  Continue metformin 750 mg daily.  Dyslipidemia Follow-up with cardiology.  Now on Repatha 140 mg every 14 days, Lipitor 80 mg daily, and Zetia 10 mg daily. Check lipids.  We discussed lifestyle modifications including Mediterranean diet.  Lung nodule Continue management per pulmonology.  CT scan earlier this year showed no nodule or mass. .   Preventative Healthcare: Check labs.  Up-to-date on vaccines.  She did not have follow-up breast imaging last year - we will reorder this today with diagnostic mammogram radiology recommendations.  She is due for colon cancer screening next year.  Patient Counseling(The following topics were reviewed and/or handout was given):  -Nutrition: Stressed importance of moderation in sodium/caffeine intake, saturated fat and cholesterol, caloric balance, sufficient intake of fresh fruits, vegetables, and fiber.  -Stressed the importance of regular exercise.   -Substance Abuse: Discussed cessation/primary prevention of tobacco, alcohol, or other drug use; driving or other dangerous activities under the influence; availability of treatment for abuse.   -Injury prevention: Discussed safety belts, safety helmets, smoke detector, smoking near bedding or upholstery.   -Sexuality: Discussed sexually transmitted diseases, partner selection, use of condoms, avoidance of unintended pregnancy and contraceptive alternatives.   -Dental health: Discussed importance of regular tooth brushing, flossing, and dental visits.  -Health maintenance and immunizations reviewed. Please refer to Health maintenance section.  Return to care in 1 year for next preventative visit.     Subjective:  HPI:  She has  no acute complaints today. She is here today for annual physical. Since our last visit she has been following with cardiology and was recently started on Repatha. She has done well with this.   Overall feels well.  She has seen pulmonology since her last physical and they are monitoring her lung nodule.  At our last physical we did discuss need for repeat diagnostic mammogram due to abnormal mammogram in August 2023.  This was ordered however unfortunately this was not completed.  Patient and husband are not clear why. She has not had any repeat imaging since August of last year.   Lifestyle Diet: Balanced. Plenty of fruits and vegetables.  Exercise: Walking frequently.      01/20/2023    8:22 AM  Depression screen PHQ 2/9  Decreased Interest 0  Down, Depressed, Hopeless 0  PHQ - 2 Score 0    There are no preventive care reminders to display for this patient.   ROS: Per HPI, otherwise a complete review of systems was negative.   PMH:  The following were reviewed and entered/updated in epic: Past Medical History:  Diagnosis Date   High cholesterol    Stroke [facility])    Patient Active Problem List   Diagnosis Date Noted   Allergic rhinitis 01/07/2022   Lung nodule 01/07/2022   Dyslipidemia 01/07/2022   Osteoarthritis 01/09/2019   Prediabetes 10/12/2018   Neuropathic pain 10/12/2018   History of stroke 10/02/2018   Essential hypertension 10/02/2018   Past Surgical History:  Procedure Laterality Date   HEMORRHOID SURGERY      Family History  Problem Relation Age of Onset   Heart disease Brother    Heart failure Brother    Heart attack Neg Hx    Stroke Neg Hx  Medications- reviewed and updated Current Outpatient Medications  Medication Sig Dispense Refill   amLODipine (NORVASC) 10 MG tablet TAKE 1 TABLET BY MOUTH EVERY DAY 90 tablet 3   aspirin 81 MG chewable tablet Chew by mouth daily.     atorvastatin (LIPITOR) 80 MG tablet TAKE 1 TABLET BY MOUTH EVERYDAY AT  BEDTIME 90 tablet 3   Evolocumab (REPATHA SURECLICK) 140 MG/ML SOAJ Inject 140 mg into the skin every 14 (fourteen) days. 6 mL 0   ezetimibe (ZETIA) 10 MG tablet Take 1 tablet (10 mg total) by mouth daily. 90 tablet 3   metFORMIN (GLUCOPHAGE-XR) 750 MG 24 hr tablet TAKE 1 TABLET BY MOUTH EVERY DAY WITH BREAKFAST 90 tablet 3   pantoprazole (PROTONIX) 40 MG tablet TAKE 1 TABLET BY MOUTH EVERY DAY IN THE MORNING 90 tablet 3   No current facility-administered medications for this visit.    Allergies-reviewed and updated No Known Allergies  Social History   Socioeconomic History   Marital status: Married    Spouse name: Not on file   Number of children: 1   Years of education: Not on file   Highest education level: Not on file  Occupational History   Not on file  Tobacco Use   Smoking status: Never   Smokeless tobacco: Never  Vaping Use   Vaping status: Never Used  Substance and Sexual Activity   Alcohol use: Never   Drug use: Never   Sexual activity: Not on file  Other Topics Concern   Not on file  Social History Narrative   Not on file   Social Drivers of Health   Financial Resource Strain: Low Risk  (05/17/2022)   Overall Financial Resource Strain (CARDIA)    Difficulty of Paying Living Expenses: Not hard at all  Food Insecurity: No Food Insecurity (05/17/2022)   Hunger Vital Sign    Worried About Running Out of Food in the Last Year: Never true    Ran Out of Food in the Last Year: Never true  Transportation Needs: No Transportation Needs (05/17/2022)   PRAPARE - Administrator, Civil Service (Medical): No    Lack of Transportation (Non-Medical): No  Physical Activity: Insufficiently Active (05/17/2022)   Exercise Vital Sign    Days of Exercise per Week: 3 days    Minutes of Exercise per Session: 40 min  Stress: No Stress Concern Present (05/17/2022)   [employer] of Occupational Health - Occupational Stress Questionnaire    Feeling of Stress : Not at  all  Social Connections: Moderately Isolated (05/17/2022)   Social Connection and Isolation Panel [NHANES]    Frequency of Communication with Friends and Family: More than three times a week    Frequency of Social Gatherings with Friends and Family: More than three times a week    Attends Religious Services: Never    Database administrator or Organizations: No    Attends Engineer, structural: Never    Marital Status: Married        Objective:  Physical Exam: BP 120/70   Pulse 71   Temp (!) 97.5 F (36.4 C) (Temporal)   Ht 5\' 1"  (1.549 m)   Wt 132 lb 12.8 oz (60.2 kg)   SpO2 98%   BMI 25.09 kg/m   Body mass index is 25.09 kg/m. Wt Readings from Last 3 Encounters:  01/20/23 132 lb 12.8 oz (60.2 kg)  01/05/23 133 lb 6.4 oz (60.5 kg)  10/06/22 135 lb 3.2 oz (  61.3 kg)   Gen: NAD, resting comfortably HEENT: TMs normal bilaterally. OP clear. No thyromegaly noted.  CV: RRR with no murmurs appreciated Pulm: NWOB, CTAB with no crackles, wheezes, or rhonchi GI: Normal bowel sounds present. Soft, Nontender, Nondistended. MSK: no edema, cyanosis, or clubbing noted Skin: warm, dry Neuro: CN2-12 grossly intact. Strength 5/5 in upper and lower extremities. Reflexes symmetric and intact bilaterally.  Psych: Normal affect and thought content     Dallan Schonberg M. Jimmey Ralph, MD 01/20/2023 8:52 AM

## 2023-01-20 NOTE — Assessment & Plan Note (Addendum)
Continue management per pulmonology.  CT scan earlier this year showed no nodule or mass. Marland Kitchen

## 2023-01-20 NOTE — Assessment & Plan Note (Addendum)
Follow-up with cardiology.  Now on Repatha 140 mg every 14 days, Lipitor 80 mg daily, and Zetia 10 mg daily. Check lipids.  We discussed lifestyle modifications including Mediterranean diet.

## 2023-01-20 NOTE — Assessment & Plan Note (Signed)
Check A1c.  Continue metformin 750 mg daily.

## 2023-01-21 NOTE — Progress Notes (Signed)
Can we check with the lab about her LDL value? This should not be a negative number.  Recommend checking direct LDL if they are not able to correct this.  Please place future order if needed.  One of her liver numbers is slightly elevated.  Recommend she come back in a couple weeks to recheck.  Please place future order for CMET.  Her A1c is in the borderline diabetic range but stable compared to previous.  She should continue to work on diet and exercise and we can recheck this again in 6 months.  Everything else is stable and we can recheck in a year.

## 2023-01-24 ENCOUNTER — Other Ambulatory Visit: Payer: Self-pay

## 2023-01-24 LAB — LIPID PANEL
Cholesterol: 99 mg/dL (ref 0–200)
HDL: 61.1 mg/dL (ref >39.00)
NonHDL: 38.29
Total CHOL/HDL Ratio: 2
Triglycerides: 208 mg/dL — ABNORMAL HIGH (ref 0.0–149.0)
VLDL: 41.6 mg/dL — ABNORMAL HIGH (ref 0.0–40.0)

## 2023-01-24 MED ORDER — REPATHA SURECLICK 140 MG/ML ~~LOC~~ SOAJ: 140.0000 mg | SUBCUTANEOUS | 0 refills | Status: DC

## 2023-01-24 NOTE — Telephone Encounter (Signed)
Requested Prescriptions   Signed Prescriptions Disp Refills   Evolocumab (REPATHA SURECLICK) 140 MG/ML SOAJ 6 mL 0    Sig: Inject 140 mg into the skin every 14 (fourteen) days.    Authorizing Provider: Debbe Odea    Ordering User: Guerry Minors     last office visit: 01/05/23 plan to f/u in 3 months  next visit:  04/06/23

## 2023-02-01 ENCOUNTER — Other Ambulatory Visit: Payer: Self-pay | Admitting: *Deleted

## 2023-02-01 DIAGNOSIS — E785 Hyperlipidemia, unspecified: Secondary | ICD-10-CM

## 2023-02-01 DIAGNOSIS — R748 Abnormal levels of other serum enzymes: Secondary | ICD-10-CM

## 2023-02-02 ENCOUNTER — Other Ambulatory Visit: Payer: Self-pay | Admitting: Family Medicine

## 2023-02-04 IMAGING — MG MM DIGITAL SCREENING BILAT W/ TOMO AND CAD
8 series · 8 of 24 positions shown · non-contrast
Comparison: None.

CLINICAL DATA: Screening.

EXAM:
DIGITAL SCREENING BILATERAL MAMMOGRAM WITH TOMOSYNTHESIS AND CAD
TECHNIQUE: Bilateral screening digital craniocaudal and mediolateral oblique
mammograms were obtained. Bilateral screening digital breast
tomosynthesis was performed. The images were evaluated with
computer-aided detection.

[L CC synth-2D]
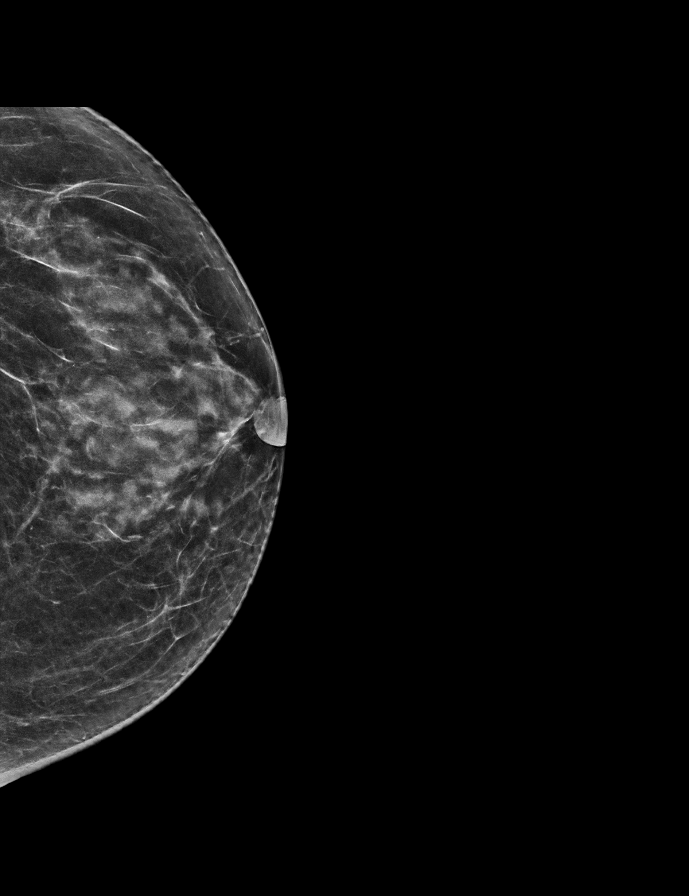

[L MLO synth-2D]
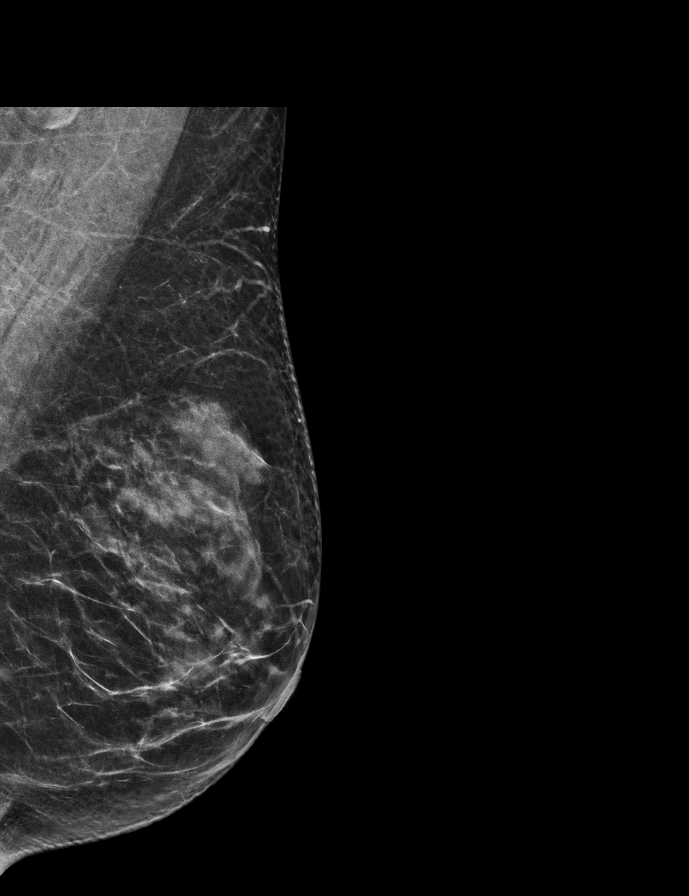

[R MLO synth-2D]
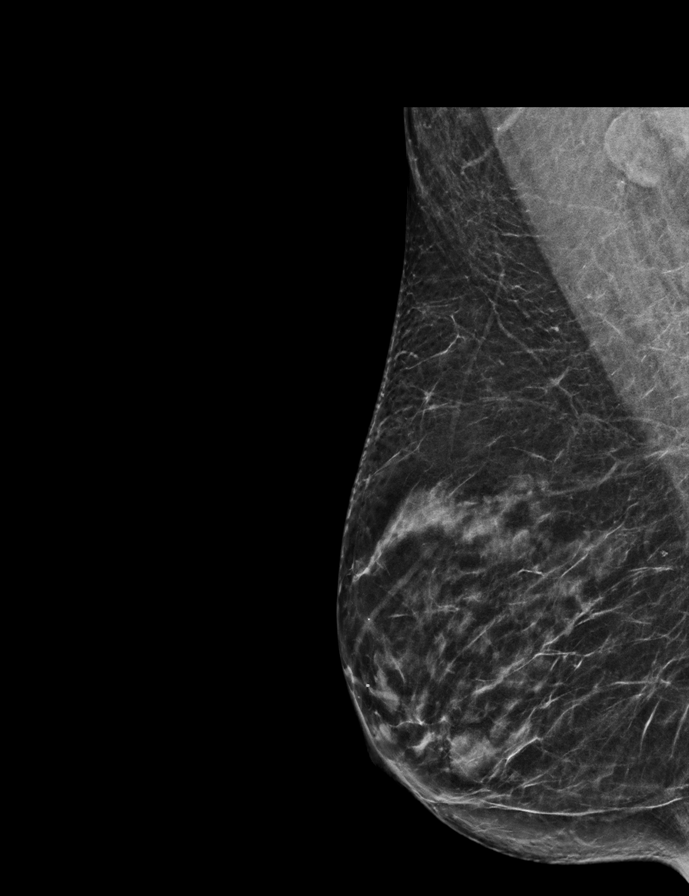

[R CC synth-2D]
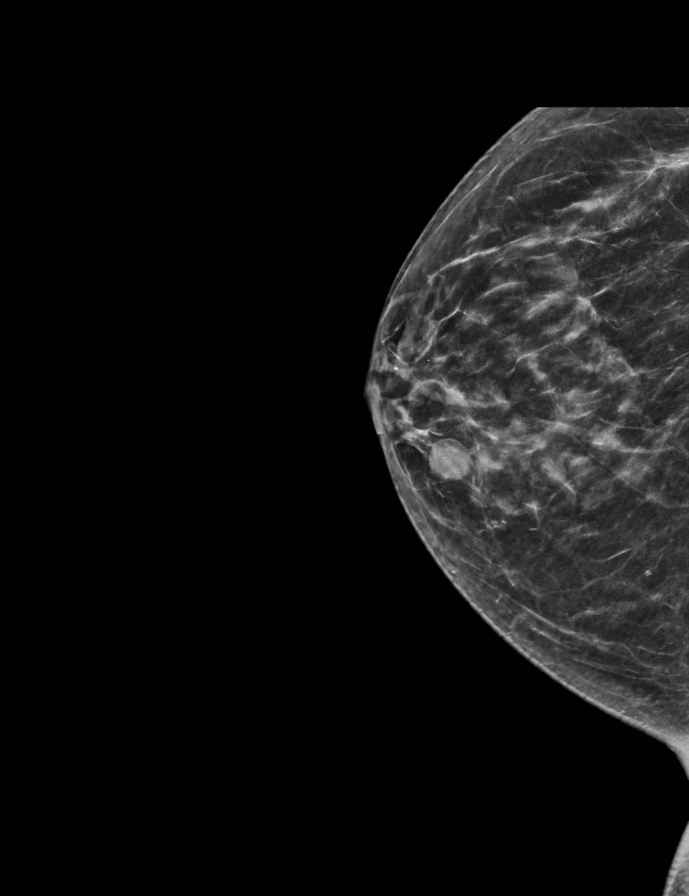

[R MLO tomo · tomo slice 33/64.0]
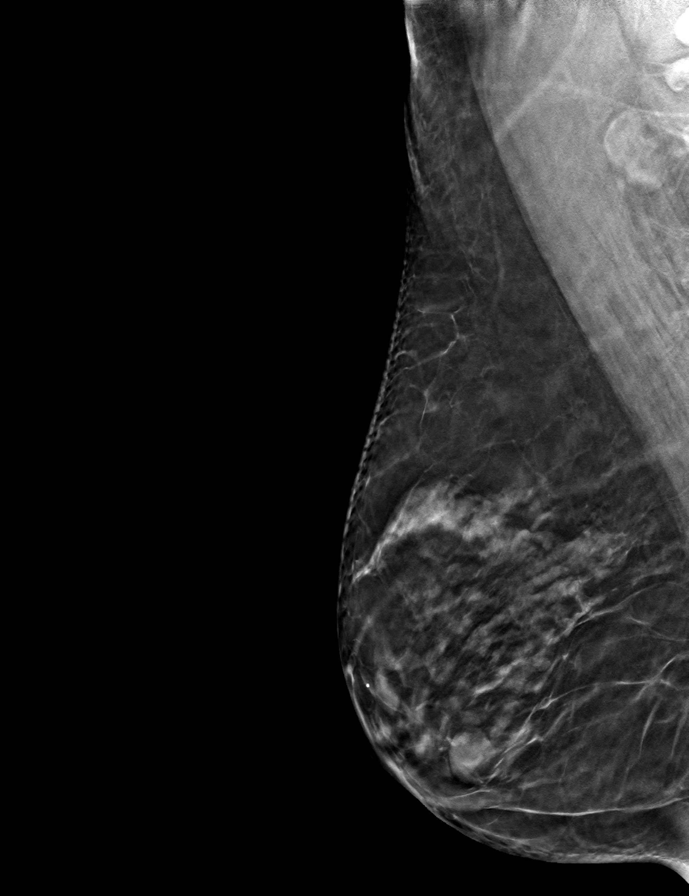

[L MLO tomo · tomo slice 31/60.0]
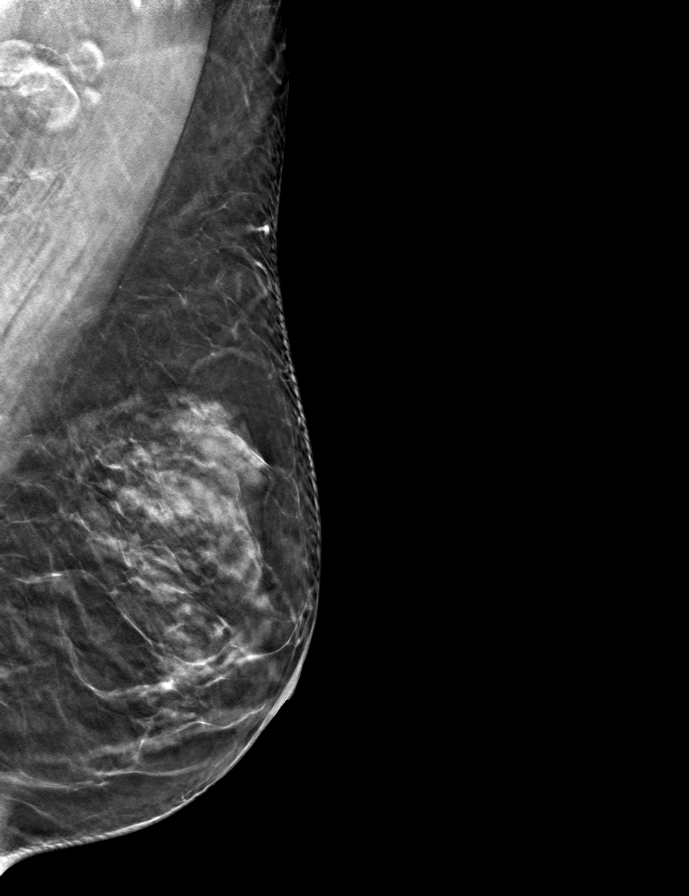

[L CC tomo · tomo slice 31/60.0]
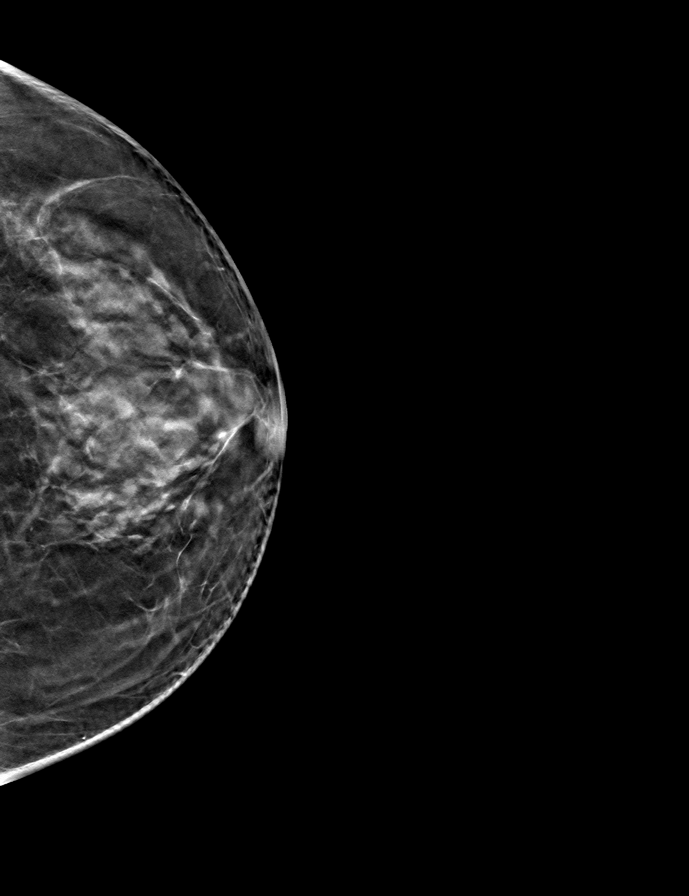

[R CC tomo · tomo slice 28/55.0]
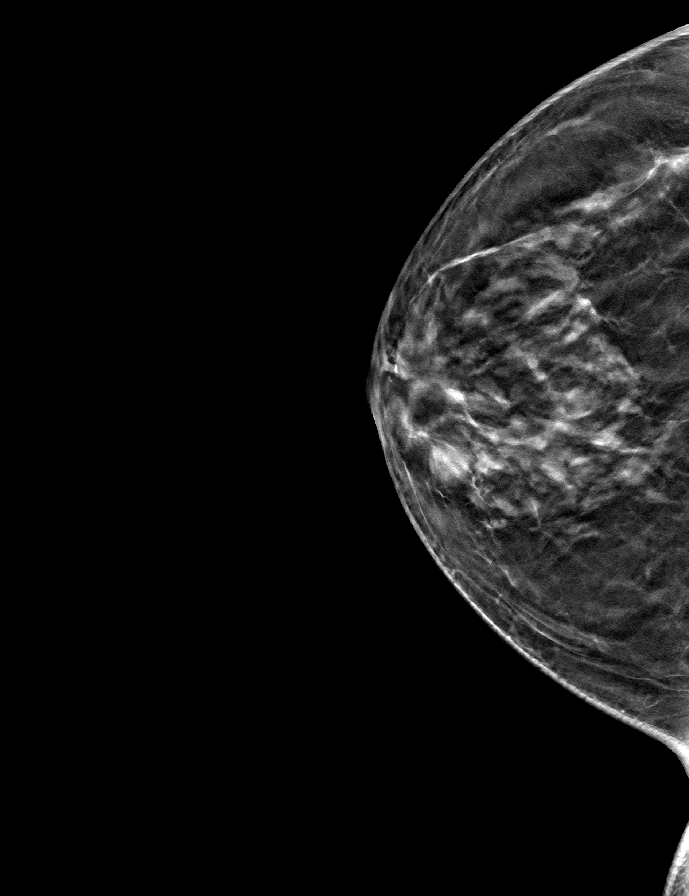

[8 of 24 positions shown; findings below may reference images not displayed]

ACR Breast Density Category c: The breast tissue is heterogeneously
dense, which may obscure small masses.
FINDINGS: In the right breast, a possible mass and calcifications warrants
further evaluation. This possible mass is seen within the inner
RIGHT breast, at anterior depth, cc slice 45. The calcifications are
seen within the upper inner RIGHT breast, at posterior depth, MLO
slice 18.

In the left breast, no findings suspicious for malignancy.
IMPRESSION: Further evaluation is suggested for a possible mass and
calcifications in the right breast.

RECOMMENDATION:
Diagnostic mammogram with magnification views for the RIGHT breast
calcifications and ultrasound of the right breast for the RIGHT
breast mass. (Code:[id number])

The patient will be contacted regarding the findings, and additional
imaging will be scheduled.

BI-RADS CATEGORY  0: Incomplete. Need additional imaging evaluation
and/or prior mammograms for comparison.

## 2023-02-07 ENCOUNTER — Other Ambulatory Visit: Payer: Medicare Other

## 2023-02-09 ENCOUNTER — Other Ambulatory Visit (INDEPENDENT_AMBULATORY_CARE_PROVIDER_SITE_OTHER): Payer: Medicare Other

## 2023-02-09 DIAGNOSIS — R748 Abnormal levels of other serum enzymes: Secondary | ICD-10-CM | POA: Diagnosis not present

## 2023-02-09 DIAGNOSIS — E785 Hyperlipidemia, unspecified: Secondary | ICD-10-CM

## 2023-02-09 LAB — LIPID PANEL
Cholesterol: 118 mg/dL (ref 0–200)
HDL: 55.3 mg/dL (ref >39.00)
LDL Cholesterol: 27 mg/dL (ref 0–99)
NonHDL: 62.21
Total CHOL/HDL Ratio: 2
Triglycerides: 177 mg/dL — ABNORMAL HIGH (ref 0.0–149.0)
VLDL: 35.4 mg/dL (ref 0.0–40.0)

## 2023-02-09 LAB — COMPREHENSIVE METABOLIC PANEL
ALT: 23 U/L (ref 0–35)
AST: 18 U/L (ref 0–37)
Albumin: 4.9 g/dL (ref 3.5–5.2)
Alkaline Phosphatase: 72 U/L (ref 39–117)
BUN: 14 mg/dL (ref 6–23)
CO2: 27 meq/L (ref 19–32)
Calcium: 9.7 mg/dL (ref 8.4–10.5)
Chloride: 102 meq/L (ref 96–112)
Creatinine, Ser: 0.74 mg/dL (ref 0.40–1.20)
GFR: 82.11 mL/min (ref >60.00)
Glucose, Bld: 112 mg/dL — ABNORMAL HIGH (ref 70–99)
Potassium: 4.2 meq/L (ref 3.5–5.1)
Sodium: 140 meq/L (ref 135–145)
Total Bilirubin: 0.6 mg/dL (ref 0.2–1.2)
Total Protein: 7.9 g/dL (ref 6.0–8.3)

## 2023-02-10 ENCOUNTER — Ambulatory Visit
Admission: RE | Admit: 2023-02-10 | Discharge: 2023-02-10 | Disposition: A | Discharge status: Home / Self Care | Payer: Medicare Other | Source: Ambulatory Visit | Attending: Family Medicine | Admitting: Family Medicine

## 2023-02-10 DIAGNOSIS — R928 Other abnormal and inconclusive findings on diagnostic imaging of breast: Secondary | ICD-10-CM

## 2023-02-10 DIAGNOSIS — N6001 Solitary cyst of right breast: Secondary | ICD-10-CM | POA: Diagnosis not present

## 2023-02-10 DIAGNOSIS — R92331 Mammographic heterogeneous density, right breast: Secondary | ICD-10-CM | POA: Diagnosis not present

## 2023-02-10 NOTE — Progress Notes (Signed)
 Cholesterol levels is at goal and liver numbers are back to baseline.  We can recheck again at her next office visit.  Do not need to make any other adjustments at this point.

## 2023-03-29 ENCOUNTER — Telehealth: Payer: Self-pay | Admitting: *Deleted

## 2023-03-29 NOTE — Telephone Encounter (Signed)
 Copied from CRM [id number]. Topic: General - Other >> Mar 28, 2023 10:14 AM Aletta Edouard wrote: Reason for CRM: patient is needing a call back regarding the appt she has with dr Debbe Odea, MD that dr is no longer at that office

## 2023-03-29 NOTE — Telephone Encounter (Signed)
 Copied from CRM [id number]. Topic: General - Other >> Mar 28, 2023 10:14 AM Aletta Edouard wrote: Reason for CRM: patient is needing a call back regarding the appt she has with dr Debbe Odea, MD that dr is no longer at that office  Advise patient to call for appt with a other provider in the clinic, let us know if we need to send a new referral

## 2023-03-31 DIAGNOSIS — Z79899 Other long term (current) drug therapy: Secondary | ICD-10-CM | POA: Diagnosis not present

## 2023-04-01 LAB — LIPID PANEL
Chol/HDL Ratio: 2.5 {ratio} (ref 0.0–4.4)
Cholesterol, Total: 133 mg/dL (ref 100–199)
HDL: 53 mg/dL (ref >39)
LDL Chol Calc (NIH): 48 mg/dL (ref 0–99)
Triglycerides: 196 mg/dL — ABNORMAL HIGH (ref 0–149)
VLDL Cholesterol Cal: 32 mg/dL (ref 5–40)

## 2023-04-06 ENCOUNTER — Encounter: Payer: Self-pay | Admitting: Cardiology

## 2023-04-06 ENCOUNTER — Ambulatory Visit: Payer: Medicare Other | Attending: Cardiology | Admitting: Cardiology

## 2023-04-06 VITALS — BP 117/76 | HR 82 | Ht 61.0 in | Wt 138.0 lb

## 2023-04-06 DIAGNOSIS — E782 Mixed hyperlipidemia: Secondary | ICD-10-CM | POA: Diagnosis not present

## 2023-04-06 DIAGNOSIS — I2510 Atherosclerotic heart disease of native coronary artery without angina pectoris: Secondary | ICD-10-CM

## 2023-04-06 DIAGNOSIS — I10 Essential (primary) hypertension: Secondary | ICD-10-CM | POA: Diagnosis not present

## 2023-04-06 MED ORDER — FENOFIBRATE 145 MG PO TABS: 145.0000 mg | ORAL_TABLET | Freq: Every day | ORAL | 3 refills | Status: AC

## 2023-04-06 NOTE — Patient Instructions (Signed)
 Medication Instructions:  Your physician recommends the following medication changes.  START TAKING: Fenofibrate 145 mg daily  *If you need a refill on your cardiac medications before your next appointment, please call your pharmacy*   Lab Work: Your provider would like for you to return in 6 months to have the following labs drawn: Lipid panel.   Please go to [facility] [address] ([facility]) [address], [place] [address] You do not need an appointment.  They are open from 8 am- 4:30 pm.  Lunch from 1:00 pm- 2:00 pm You DO need to be fasting.  You may also go to one of the following [facility]:  [address] [place], [place] [address] Phone: [phone] Lab hours: Mon-Fri 8 am- 5 pm    Lunch 12 pm- 1 pm  [address] [place],  [place]  [address]  [place] Phone: [phone] Lab hours: 7 am- 4 pm Lunch 12 pm-1 pm   [address] [place],  [place]  [address]  [place] Phone: [phone] Lab hours: Mon-Fri 8 am- 5 pm    Lunch 12 pm- 1 pm  If you have labs (blood work) drawn today and your tests are completely normal, you will receive your results only by: MyChart Message (if you have MyChart) OR A paper copy in the mail If you have any lab test that is abnormal or we need to change your treatment, we will call you to review the results.   Follow-Up: At [facility], you and your health needs are our priority.  As part of our continuing mission to provide you with exceptional heart care, we have created designated Provider Care Teams.  These Care Teams include your primary Cardiologist (physician) and Advanced Practice Providers (APPs -  Physician Assistants and Nurse Practitioners) who all work together to provide you with the care you need, when you need it.   Your next appointment:   7 month(s)  Provider:   You may see Debbe Odea, MD or one of the following Advanced Practice Providers on your designated Care Team:   Nicolasa Ducking,  NP Eula Listen, PA-C Cadence Fransico Michael, PA-C Charlsie Quest, NP Carlos Levering, NP

## 2023-04-06 NOTE — Progress Notes (Signed)
 Cardiology Office Note:    Date:  04/06/2023   ID:  Tammy Ramos, DOB 1950/12/01, MRN [id number]  PCP:  Ardith Dark, MD   [facility] Providers Cardiologist:  Debbe Odea, MD     Referring MD: Ardith Dark, MD   Chief Complaint  Patient presents with   Follow-up    3 month follow up visit. Patient is doing well on today. Meds reviewed.    History of Present Illness:    Tammy Ramos is a 73 y.o. female with a hx of mild nonobstructive CAD, hyperlipidemia, hypertension, CVA 2018 who presents for follow-up.  Previously started on Repatha, tolerating Repatha with no adverse effects.  For some reason insurance company does not want to cover Repatha moving forward.  Husband to inquire reason from insurance company.  She is compliant with Zetia and Lipitor as prescribed.  Overall doing okay.   Prior notes/studies Coronary CT 7/24 mild nonobstructive CAD in LAD, left circumflex, RCA 25 to 49% Echo 7/24 EF 55 to 60% brother had two-vessel CABG in his 73s.  Parents died when she was very young, no known cardiac history.  Had a history of aphasia 6 years ago, diagnosed with a stroke.  Past Medical History:  Diagnosis Date   High cholesterol    Stroke [facility])     Past Surgical History:  Procedure Laterality Date   HEMORRHOID SURGERY      Current Medications: Current Meds  Medication Sig   amLODipine (NORVASC) 10 MG tablet TAKE 1 TABLET BY MOUTH EVERY DAY   aspirin 81 MG chewable tablet Chew by mouth daily.   atorvastatin (LIPITOR) 80 MG tablet TAKE 1 TABLET BY MOUTH EVERYDAY AT BEDTIME   Evolocumab (REPATHA SURECLICK) 140 MG/ML SOAJ Inject 140 mg into the skin every 14 (fourteen) days.   ezetimibe (ZETIA) 10 MG tablet Take 1 tablet (10 mg total) by mouth daily.   fenofibrate (TRICOR) 145 MG tablet Take 1 tablet (145 mg total) by mouth daily.   metFORMIN (GLUCOPHAGE-XR) 750 MG 24 hr tablet TAKE 1 TABLET BY MOUTH EVERY DAY WITH BREAKFAST   pantoprazole  (PROTONIX) 40 MG tablet TAKE 1 TABLET BY MOUTH EVERY DAY IN THE MORNING     Allergies:   Patient has no known allergies.   Social History   Socioeconomic History   Marital status: Married    Spouse name: Not on file   Number of children: 1   Years of education: Not on file   Highest education level: Not on file  Occupational History   Not on file  Tobacco Use   Smoking status: Never   Smokeless tobacco: Never  Vaping Use   Vaping status: Never Used  Substance and Sexual Activity   Alcohol use: Never   Drug use: Never   Sexual activity: Not on file  Other Topics Concern   Not on file  Social History Narrative   Not on file   Social Drivers of Health   Financial Resource Strain: Low Risk  (05/17/2022)   Overall Financial Resource Strain (CARDIA)    Difficulty of Paying Living Expenses: Not hard at all  Food Insecurity: No Food Insecurity (05/17/2022)   Hunger Vital Sign    Worried About Running Out of Food in the Last Year: Never true    Ran Out of Food in the Last Year: Never true  Transportation Needs: No Transportation Needs (05/17/2022)   PRAPARE - Administrator, Civil Service (Medical): No  Lack of Transportation (Non-Medical): No  Physical Activity: Insufficiently Active (05/17/2022)   Exercise Vital Sign    Days of Exercise per Week: 3 days    Minutes of Exercise per Session: 40 min  Stress: No Stress Concern Present (05/17/2022)   [employer] of Occupational Health - Occupational Stress Questionnaire    Feeling of Stress : Not at all  Social Connections: Moderately Isolated (05/17/2022)   Social Connection and Isolation Panel [NHANES]    Frequency of Communication with Friends and Family: More than three times a week    Frequency of Social Gatherings with Friends and Family: More than three times a week    Attends Religious Services: Never    Database administrator or Organizations: No    Attends Engineer, structural: Never     Marital Status: Married     Family History: The patient's family history includes Heart disease in her brother; Heart failure in her brother. There is no history of Heart attack or Stroke.  ROS:   Please see the history of present illness.     All other systems reviewed and are negative.  EKGs/Labs/Other Studies Reviewed:    The following studies were reviewed today:        Recent Labs: 01/20/2023: Hemoglobin 13.5; Platelets 278.0; TSH 1.27 02/09/2023: ALT 23; BUN 14; Creatinine, Ser 0.74; Potassium 4.2; Sodium 140  Recent Lipid Panel    Component Value Date/Time   CHOL 133 03/31/2023 0831   TRIG 196 (H) 03/31/2023 0831   HDL 53 03/31/2023 0831   CHOLHDL 2.5 03/31/2023 0831   CHOLHDL 2 02/09/2023 0842   VLDL 35.4 02/09/2023 0842   LDLCALC 48 03/31/2023 0831   LDLCALC 115 (H) 01/07/2020 0834   LDLDIRECT 275.0 01/07/2022 1043     Risk Assessment/Calculations:             Physical Exam:    VS:  BP 117/76   Pulse 82   Ht 5\' 1"  (1.549 m)   Wt 138 lb (62.6 kg)   SpO2 96%   BMI 26.07 kg/m     Wt Readings from Last 3 Encounters:  04/06/23 138 lb (62.6 kg)  01/20/23 132 lb 12.8 oz (60.2 kg)  01/05/23 133 lb 6.4 oz (60.5 kg)     GEN:  Well nourished, well developed in no acute distress HEENT: Normal NECK: No JVD; No carotid bruits CARDIAC: RRR, no murmurs, rubs, gallops RESPIRATORY:  Clear to auscultation without rales, wheezing or rhonchi  ABDOMEN: Soft, non-tender, non-distended MUSCULOSKELETAL:  No edema; No deformity  SKIN: Warm and dry NEUROLOGIC:  Alert and oriented x 3 PSYCHIATRIC:  Normal affect   ASSESSMENT:    1. Coronary artery disease involving native coronary artery of native heart, unspecified whether angina present   2. Mixed hyperlipidemia   3. Primary hypertension    PLAN:    In order of problems listed above:  Mild nonobstructive CAD (25-49%), involving LAD, left circumflex, distal RCA.  EF 55 to 60%.  Continue aspirin 81 mg daily,  Lipitor 80. Hyperlipidemia, LDL, total cholesterol adequately controlled.  Triglycerides elevated.  Start fenofibrate.  Continue  Lipitor 80 mg daily, Zetia 10 mg daily.  Repeat lipid panel in 6 months.  Insurance company not covering Repatha anymore Hypertension, BP controlled, continue Norvasc 10 mg daily.  Follow-up in 6-7 months.        Medication Adjustments/Labs and Tests Ordered: Current medicines are reviewed at length with the patient today.  Concerns regarding medicines are  outlined above.  Orders Placed This Encounter  Procedures   Lipid panel   Meds ordered this encounter  Medications   fenofibrate (TRICOR) 145 MG tablet    Sig: Take 1 tablet (145 mg total) by mouth daily.    Dispense:  90 tablet    Refill:  3    Patient Instructions  Medication Instructions:  Your physician recommends the following medication changes.  START TAKING: Fenofibrate 145 mg daily  *If you need a refill on your cardiac medications before your next appointment, please call your pharmacy*   Lab Work: Your provider would like for you to return in 6 months to have the following labs drawn: Lipid panel.   Please go to [facility] [address] ([facility]) [address], [place] [address] You do not need an appointment.  They are open from 8 am- 4:30 pm.  Lunch from 1:00 pm- 2:00 pm You DO need to be fasting.  You may also go to one of the following [facility]:  [address] [place], [place] [address] Phone: [phone] Lab hours: Mon-Fri 8 am- 5 pm    Lunch 12 pm- 1 pm  [address] [place],  [place]  [address]  [place] Phone: [phone] Lab hours: 7 am- 4 pm Lunch 12 pm-1 pm   [address] [place],  [place]  [address]  [place] Phone: [phone] Lab hours: Mon-Fri 8 am- 5 pm    Lunch 12 pm- 1 pm  If you have labs (blood work) drawn today and your tests are completely normal, you will receive your results only by: MyChart Message (if you have MyChart) OR A  paper copy in the mail If you have any lab test that is abnormal or we need to change your treatment, we will call you to review the results.   Follow-Up: At [facility], you and your health needs are our priority.  As part of our continuing mission to provide you with exceptional heart care, we have created designated Provider Care Teams.  These Care Teams include your primary Cardiologist (physician) and Advanced Practice Providers (APPs -  Physician Assistants and Nurse Practitioners) who all work together to provide you with the care you need, when you need it.   Your next appointment:   7 month(s)  Provider:   You may see Debbe Odea, MD or one of the following Advanced Practice Providers on your designated Care Team:   Nicolasa Ducking, NP Eula Listen, PA-C Cadence Fransico Michael, PA-C Charlsie Quest, NP Carlos Levering, NP    Signed, Debbe Odea, MD  04/06/2023 11:09 AM    [facility]

## 2023-04-11 ENCOUNTER — Ambulatory Visit
Admission: RE | Admit: 2023-04-11 | Discharge: 2023-04-11 | Disposition: A | Discharge status: Home / Self Care | Source: Ambulatory Visit | Attending: Acute Care | Admitting: Acute Care

## 2023-04-11 DIAGNOSIS — R911 Solitary pulmonary nodule: Secondary | ICD-10-CM | POA: Insufficient documentation

## 2023-04-11 DIAGNOSIS — R918 Other nonspecific abnormal finding of lung field: Secondary | ICD-10-CM | POA: Diagnosis not present

## 2023-05-03 ENCOUNTER — Encounter: Payer: Self-pay | Admitting: Acute Care

## 2023-05-03 ENCOUNTER — Ambulatory Visit: Admitting: Acute Care

## 2023-05-03 VITALS — BP 131/77 | HR 77 | Ht 61.0 in | Wt 137.4 lb

## 2023-05-03 DIAGNOSIS — Z789 Other specified health status: Secondary | ICD-10-CM | POA: Diagnosis not present

## 2023-05-03 DIAGNOSIS — R911 Solitary pulmonary nodule: Secondary | ICD-10-CM

## 2023-05-03 NOTE — Patient Instructions (Addendum)
 It is good to see you today. Your scan shows stable small nodules. This is great news We will do a 1 year follow up scan due 04/2024. Follow up with me after to review results. Call if you need [place] sooner, or have any breathing issues.  Call to be seen sooner for any blood in your mucus, or weight loss you cannot explain. Please contact office for sooner follow up if symptoms do not improve or worsen or seek emergency care

## 2023-05-03 NOTE — Progress Notes (Signed)
 History of Present Illness Tammy Ramos is a 73 y.o. female female never smoker from [place] referred by Dr. Jacquiline Doe seen by Dr. Tonia Brooms 04/05/2022 for evaluation of a pulmonary nodule. She will be reassigned to Dr. Delton Coombes.  Synopsis 73 year -old female, past medical history of hypercholesterolemia and stroke. She had CT imaging completed in [place] 04/2021. Patient is a lifelong non-smoker. However she did grow up in [place]. She moved here 15 years ago. She had a CT scan of the chest for evaluation when she was in [place] in May 2023. That CT scan of the chest revealed multiple groundglass nodules that were found in the upper lobe of the right lung and is well as the left lung. The larger 1 is in the left upper lobe which measures about 7 mm x 5 mm and there is a solid nodule in the right middle lobe. No family history of lung cancer. She also has atherosclerosis in her coronary arteries and some degenerative changes in the spine. She was seen by Dr. Tonia Brooms 04/05/2022. He explained to her that Asian females have a 1.5% increase incidence of adenocarcinoma that are EGFR mutated. He ordered a repeat CT Chest as it has been a year since previous scan. No family history of cancer .     05/03/2023 Pt. Presents for follow up annual CT Chest . She states she is doing well. She has no breathing issues, no hemoptysis, discolored secretions, unintentional weight loss, or dyspnea. She is very active. We have discussed her repeat Ct Chest. Her scan shows 2 stable very small nodules. Per Radiology, she does not need additional follow up. She and her husband would like to do 1 additional annual scan before stopping. We will do the scan 04/2024, and she will follow up in the office to review the results.She knows to call to be seen sooner for any hemoptysis, Unexplained weight loss or breathing issues.    Test Results: CT Chest 04/11/2023, Read 05/01/2023 Mediastinum/Nodes: Small mediastinal lymph nodes which do not  meet pathologic CT size criteria.   Visualized thyroid is unremarkable.   Lungs/Pleura: Scattered small bilateral pulmonary nodules, including a 3 mm left upper lobe nodule (series 4/image 46) and a 2 mm perifissural nodule along the right minor fissure (series 4/image 73), unchanged, benign. No follow-up is recommended per Fleischner Society guidelines.   Additional calcified granulomata, benign. No new/suspicious pulmonary nodules.   No focal consolidation.   No pleural effusion or pneumothorax.   Upper Abdomen: Visualized upper abdomen is notable for left renal cysts and vascular calcifications.   Musculoskeletal: Stable 10 mm rounded nodule in the central right breast (series 2/image 74), likely benign.   Degenerative changes of the visualized thoracolumbar spine.   IMPRESSION: Scattered small bilateral pulmonary nodules, unchanged, benign. No follow-up is recommended per Fleischner Society guidelines.       Latest Ref Rng & Units 01/20/2023    8:51 AM 01/07/2022   10:43 AM 01/05/2021    3:43 PM  CBC  WBC 4.0 - 10.5 K/uL 5.7  5.2  6.4   Hemoglobin 12.0 - 15.0 g/dL 84.1  32.4  40.1   Hematocrit 36.0 - 46.0 % 39.1  44.0  39.4   Platelets 150.0 - 400.0 K/uL 278.0  280.0  225.0        Latest Ref Rng & Units 02/09/2023    8:42 AM 01/20/2023    8:51 AM 01/07/2022   10:43 AM  BMP  Glucose 70 - 99  mg/dL 604  540  981   BUN 6 - 23 mg/dL 14  14  12    Creatinine 0.40 - 1.20 mg/dL 1.91  4.78  2.95   Sodium 135 - 145 mEq/L 140  139  134   Potassium 3.5 - 5.1 mEq/L 4.2  4.0  4.5   Chloride 96 - 112 mEq/L 102  101  93   CO2 19 - 32 mEq/L 27  27  31    Calcium 8.4 - 10.5 mg/dL 9.7  9.8  62.1     BNP No results found for: "BNP"  ProBNP No results found for: "PROBNP"  PFT No results found for: "FEV1PRE", "FEV1POST", "FVCPRE", "FVCPOST", "TLC", "DLCOUNC", "PREFEV1FVCRT", "PSTFEV1FVCRT"  CT CHEST WO CONTRAST Result Date: 05/01/2023 CLINICAL DATA:  Follow-up pulmonary  nodule EXAM: CT CHEST WITHOUT CONTRAST TECHNIQUE: Multidetector CT imaging of the chest was performed following the standard protocol without IV contrast. RADIATION DOSE REDUCTION: This exam was performed according to the departmental dose-optimization program which includes automated exposure control, adjustment of the mA and/or kV according to patient size and/or use of iterative reconstruction technique. COMPARISON:  CT chest dated 04/12/2022 FINDINGS: Cardiovascular: Heart is top-normal in size. No pericardial effusion. No evidence of thoracic aortic aneurysm. Atherosclerotic calcifications of the aortic arch. Moderate three-vessel coronary atherosclerosis. Mediastinum/Nodes: Small mediastinal lymph nodes which do not meet pathologic CT size criteria. Visualized thyroid is unremarkable. Lungs/Pleura: Scattered small bilateral pulmonary nodules, including a 3 mm left upper lobe nodule (series 4/image 46) and a 2 mm perifissural nodule along the right minor fissure (series 4/image 73), unchanged, benign. No follow-up is recommended per Fleischner Society guidelines. Additional calcified granulomata, benign. No new/suspicious pulmonary nodules. No focal consolidation. No pleural effusion or pneumothorax. Upper Abdomen: Visualized upper abdomen is notable for left renal cysts and vascular calcifications. Musculoskeletal: Stable 10 mm rounded nodule in the central right breast (series 2/image 74), likely benign. Degenerative changes of the visualized thoracolumbar spine. IMPRESSION: Scattered small bilateral pulmonary nodules, unchanged, benign. No follow-up is recommended per Fleischner Society guidelines. Aortic Atherosclerosis (ICD10-I70.0). Electronically Signed   By: Charline Bills M.D.   On: 05/01/2023 20:25     Past medical hx Past Medical History:  Diagnosis Date   High cholesterol    Stroke [facility])      Social History   Tobacco Use   Smoking status: Never    Passive exposure: Never    Smokeless tobacco: Never  Vaping Use   Vaping status: Never Used  Substance Use Topics   Alcohol use: Never   Drug use: Never    [name] reports that she has never smoked. She has never been exposed to tobacco smoke. She has never used smokeless tobacco. She reports that she does not drink alcohol and does not use drugs.  Tobacco Cessation: Counseling given: Not Answered Never Smoker  Past surgical hx, Family hx, Social hx all reviewed.  Current Outpatient Medications on File Prior to Visit  Medication Sig   amLODipine (NORVASC) 10 MG tablet TAKE 1 TABLET BY MOUTH EVERY DAY   aspirin 81 MG chewable tablet Chew by mouth daily.   atorvastatin (LIPITOR) 80 MG tablet TAKE 1 TABLET BY MOUTH EVERYDAY AT BEDTIME   Evolocumab (REPATHA SURECLICK) 140 MG/ML SOAJ Inject 140 mg into the skin every 14 (fourteen) days.   ezetimibe (ZETIA) 10 MG tablet Take 1 tablet (10 mg total) by mouth daily.   fenofibrate (TRICOR) 145 MG tablet Take 1 tablet (145 mg total) by mouth daily.  metFORMIN (GLUCOPHAGE-XR) 750 MG 24 hr tablet TAKE 1 TABLET BY MOUTH EVERY DAY WITH BREAKFAST   pantoprazole (PROTONIX) 40 MG tablet TAKE 1 TABLET BY MOUTH EVERY DAY IN THE MORNING   No current facility-administered medications on file prior to visit.     No Known Allergies  Review Of Systems:  Constitutional:   No  weight loss, night sweats,  Fevers, chills, fatigue, or  lassitude.  HEENT:   No headaches,  Difficulty swallowing,  Tooth/dental problems, or  Sore throat,                No sneezing, itching, ear ache, nasal congestion, post nasal drip,   CV:  No chest pain,  Orthopnea, PND, swelling in lower extremities, anasarca, dizziness, palpitations, syncope.   GI  No heartburn, indigestion, abdominal pain, nausea, vomiting, diarrhea, change in bowel habits, loss of appetite, bloody stools.   Resp: No shortness of breath with exertion or at rest.  No excess mucus, no productive cough,  No non-productive cough,   No coughing up of blood.  No change in color of mucus.  No wheezing.  No chest wall deformity  Skin: no rash or lesions.  GU: no dysuria, change in color of urine, no urgency or frequency.  No flank pain, no hematuria   MS:  No joint pain or swelling.  No decreased range of motion.  No back pain.  Psych:  No change in mood or affect. No depression or anxiety.  No memory loss.   Vital Signs BP 131/77 (BP Location: Left Arm, Patient Position: Sitting, Cuff Size: Normal)   Pulse 77   Ht 5\' 1"  (1.549 m)   Wt 137 lb 6.4 oz (62.3 kg)   SpO2 97%   BMI 25.96 kg/m    Physical Exam:  General- No distress,  A&Ox3 ENT: No sinus tenderness, TM clear, pale nasal mucosa, no oral exudate,no post nasal drip, no LAN Cardiac: S1, S2, regular rate and rhythm, no murmur Chest: No wheeze/ rales/ dullness; no accessory muscle use, no nasal flaring, no sternal retractions Abd.: Soft Non-tender, ND, BS +, Body mass index is 25.96 kg/m.  Ext: No clubbing cyanosis, edema Neuro:  normal strength, MAE x 4, A&O x 3, Appropriate Skin: No rashes, warm and dry, no lesions  Psych: normal mood and behavior   Assessment/Plan Incidentally found lung nodule in never smoker  Surveillance monitoring Plan Your scan shows stable small nodules. This is great news We will do a 1 year follow up scan due 04/2024. Follow up with me after to review results. Call if you need [place] sooner, or have any breathing issues.  Call to be seen sooner for any blood in your mucus, or weight loss you cannot explain. Please contact office for sooner follow up if symptoms do not improve or worsen or seek emergency care     I spent 17 minutes dedicated to the care of this patient on the date of this encounter to include pre-visit review of records, face-to-face time with the patient discussing conditions above, post visit ordering of testing, clinical documentation with the electronic health record, making appropriate referrals as  documented, and communicating necessary information to the patient's healthcare team.    Bevelyn Ngo, NP 05/03/2023  10:34 AM

## 2023-05-23 ENCOUNTER — Ambulatory Visit (INDEPENDENT_AMBULATORY_CARE_PROVIDER_SITE_OTHER): Payer: Medicare Other

## 2023-05-23 VITALS — BP 112/70 | HR 64 | Temp 97.5°F | Ht 61.0 in | Wt 137.8 lb

## 2023-05-23 DIAGNOSIS — Z1211 Encounter for screening for malignant neoplasm of colon: Secondary | ICD-10-CM

## 2023-05-23 DIAGNOSIS — Z0000 Encounter for general adult medical examination without abnormal findings: Secondary | ICD-10-CM

## 2023-05-23 NOTE — Progress Notes (Addendum)
 Subjective:   Tammy Ramos is a 73 y.o. who presents for a Medicare Wellness preventive visit.  Visit Complete: In person    Persons Participating in Visit: Patient.  AWV Questionnaire: No: Patient Medicare AWV questionnaire was not completed prior to this visit.  Cardiac Risk Factors include: advanced age (>53men, >84 women);dyslipidemia;hypertension     Objective:    Today's Vitals   05/23/23 1000  BP: 112/70  Pulse: 64  Temp: (!) 97.5 F (36.4 C)  SpO2: 94%  Weight: 137 lb 12.8 oz (62.5 kg)  Height: 5\' 1"  (1.549 m)   Body mass index is 26.04 kg/m.     05/23/2023   10:06 AM 05/17/2022   10:23 AM  Advanced Directives  Does Patient Have a Medical Advance Directive? No No  Would patient like information on creating a medical advance directive? No - Patient declined No - Patient declined    Current Medications (verified) Outpatient Encounter Medications as of 05/23/2023  Medication Sig   amLODipine  (NORVASC ) 10 MG tablet TAKE 1 TABLET BY MOUTH EVERY DAY   aspirin 81 MG chewable tablet Chew by mouth daily.   atorvastatin  (LIPITOR) 80 MG tablet TAKE 1 TABLET BY MOUTH EVERYDAY AT BEDTIME   ezetimibe  (ZETIA ) 10 MG tablet Take 1 tablet (10 mg total) by mouth daily.   fenofibrate  (TRICOR ) 145 MG tablet Take 1 tablet (145 mg total) by mouth daily.   metFORMIN  (GLUCOPHAGE -XR) 750 MG 24 hr tablet TAKE 1 TABLET BY MOUTH EVERY DAY WITH BREAKFAST   pantoprazole  (PROTONIX ) 40 MG tablet TAKE 1 TABLET BY MOUTH EVERY DAY IN THE MORNING   [DISCONTINUED] Evolocumab  (REPATHA  SURECLICK) 140 MG/ML SOAJ Inject 140 mg into the skin every 14 (fourteen) days.   No facility-administered encounter medications on file as of 05/23/2023.    Allergies (verified) Patient has no known allergies.   History: Past Medical History:  Diagnosis Date   High cholesterol    Stroke [facility])    Past Surgical History:  Procedure Laterality Date   HEMORRHOID SURGERY     Family History  Problem  Relation Age of Onset   Heart disease Brother    Heart failure Brother    Heart attack Neg Hx    Stroke Neg Hx    Social History   Socioeconomic History   Marital status: Married    Spouse name: Not on file   Number of children: 1   Years of education: Not on file   Highest education level: Not on file  Occupational History   Not on file  Tobacco Use   Smoking status: Never    Passive exposure: Never   Smokeless tobacco: Never  Vaping Use   Vaping status: Never Used  Substance and Sexual Activity   Alcohol use: Never   Drug use: Never   Sexual activity: Not on file  Other Topics Concern   Not on file  Social History Narrative   Not on file   Social Drivers of Health   Financial Resource Strain: Low Risk  (05/23/2023)   Overall Financial Resource Strain (CARDIA)    Difficulty of Paying Living Expenses: Not hard at all  Food Insecurity: No Food Insecurity (05/23/2023)   Hunger Vital Sign    Worried About Running Out of Food in the Last Year: Never true    Ran Out of Food in the Last Year: Never true  Transportation Needs: No Transportation Needs (05/23/2023)   PRAPARE - Administrator, Civil Service (Medical): No  Lack of Transportation (Non-Medical): No  Physical Activity: Sufficiently Active (05/23/2023)   Exercise Vital Sign    Days of Exercise per Week: 5 days    Minutes of Exercise per Session: 30 min  Stress: No Stress Concern Present (05/23/2023)   [employer] of Occupational Health - Occupational Stress Questionnaire    Feeling of Stress : Not at all  Social Connections: Moderately Isolated (05/23/2023)   Social Connection and Isolation Panel [NHANES]    Frequency of Communication with Friends and Family: More than three times a week    Frequency of Social Gatherings with Friends and Family: More than three times a week    Attends Religious Services: Never    Database administrator or Organizations: No    Attends Hospital doctor: Never    Marital Status: Married    Tobacco Counseling Counseling given: Not Answered    Clinical Intake:  Pre-visit preparation completed: Yes  Pain : No/denies pain     BMI - recorded: 26.04 Nutritional Status: BMI 25 -29 Overweight Nutritional Risks: None Diabetes: No  Lab Results  Component Value Date   HGBA1C 6.1 01/20/2023   HGBA1C 5.9 (A) 07/19/2022   HGBA1C 6.4 01/07/2022     How often do you need to have someone help you when you read instructions, pamphlets, or other written materials from your doctor or pharmacy?: 1 - Never  Interpreter Needed?: No  Information entered by :: Lamont Pilsner, LPN   Activities of Daily Living     05/23/2023   10:02 AM  In your present state of health, do you have any difficulty performing the following activities:  Hearing? 0  Vision? 0  Difficulty concentrating or making decisions? 0  Walking or climbing stairs? 0  Dressing or bathing? 0  Doing errands, shopping? 0  Preparing Food and eating ? N  Using the Toilet? N  In the past six months, have you accidently leaked urine? N  Do you have problems with loss of bowel control? N  Managing your Medications? N  Managing your Finances? N  Housekeeping or managing your Housekeeping? N    Patient Care Team: Rodney Clamp, MD as PCP - General (Family Medicine) Constancia Delton, MD as PCP - Cardiology (Cardiology)  Indicate any recent Medical Services you may have received from other than [facility] providers in the past year (date may be approximate).     Assessment:   This is a routine wellness examination for Tammy Ramos.  Hearing/Vision screen Hearing Screening - Comments:: Pt denies any hearing issues  Vision Screening - Comments:: Wears rx glasses - up to date with routine eye exams with [facility] in [place]     Goals Addressed             This Visit's Progress    Patient Stated       Maintain health and activity        Depression  Screen     05/23/2023   10:05 AM 01/20/2023    8:22 AM 07/19/2022    8:00 AM 05/17/2022   10:21 AM 01/21/2022   10:24 AM 01/07/2022   10:03 AM 01/05/2021    3:01 PM  PHQ 2/9 Scores  PHQ - 2 Score 0 0 0 0 0 0 0    Fall Risk     05/23/2023   10:07 AM 05/03/2023   10:25 AM 01/20/2023    8:22 AM 07/19/2022    8:00 AM 05/17/2022   10:24 AM  Fall Risk   Falls in the past year? 0 0 0 0 0  Number falls in past yr: 0  0 0 0  Injury with Fall? 0  0 0 0  Risk for fall due to : No Fall Risks  No Fall Risks No Fall Risks Impaired vision  Follow up Falls prevention discussed    Falls prevention discussed    MEDICARE RISK AT HOME:  Medicare Risk at Home Any stairs in or around the home?: Yes If so, are there any without handrails?: No Home free of loose throw rugs in walkways, pet beds, electrical cords, etc?: Yes Adequate lighting in your home to reduce risk of falls?: Yes Life alert?: No Use of a cane, walker or w/c?: No Grab bars in the bathroom?: Yes Shower chair or bench in shower?: No Elevated toilet seat or a handicapped toilet?: No  TIMED UP AND GO:  Was the test performed?  Yes  Length of time to ambulate 10 feet: 10 sec Gait steady and fast without use of assistive device  Cognitive Function: 6CIT completed        05/23/2023   10:08 AM  6CIT Screen  What Year? 0 points  What month? 0 points  What time? 0 points  Count back from 20 0 points  Months in reverse 0 points  Repeat phrase 0 points  Total Score 0 points    Immunizations Immunization History  Administered Date(s) Administered   H1N1 01/18/2008   Influenza, High Dose Seasonal PF 11/20/2021, 09/18/2022   Influenza-Unspecified 01/02/2020, 12/09/2020   Moderna Sars-Covid-2 Vaccination 03/18/2019, 04/16/2019, 12/19/2019   PNEUMOCOCCAL CONJUGATE-20 01/05/2021   Pneumococcal Polysaccharide-23 09/18/2022   Zoster Recombinant(Shingrix) 04/30/2020, 07/08/2020    Screening Tests Health Maintenance  Topic Date  Due   COVID-19 Vaccine (4 - 2024-25 season) 10/03/2022   Colonoscopy  08/28/2023   INFLUENZA VACCINE  09/02/2023   Medicare Annual Wellness (AWV)  05/22/2024   MAMMOGRAM  02/09/2025   Pneumonia Vaccine 49+ Years old  Completed   DEXA SCAN  Completed   Hepatitis C Screening  Completed   Zoster Vaccines- Shingrix  Completed   HPV VACCINES  Aged Out   Meningococcal B Vaccine  Aged Out   DTaP/Tdap/Td  Discontinued    Health Maintenance  Health Maintenance Due  Topic Date Due   COVID-19 Vaccine (4 - 2024-25 season) 10/03/2022   Colonoscopy  08/28/2023   Health Maintenance Items Addressed: See Nurse Notes  Additional Screening:  Vision Screening: Recommended annual ophthalmology exams for early detection of glaucoma and other disorders of the eye.  Dental Screening: Recommended annual dental exams for proper oral hygiene  Community Resource Referral / Chronic Care Management: CRR required this visit?  No   CCM required this visit?  No     Plan:     I have personally reviewed and noted the following in the patient's chart:   Medical and social history Use of alcohol, tobacco or illicit drugs  Current medications and supplements including opioid prescriptions. Patient is not currently taking opioid prescriptions. Functional ability and status Nutritional status Physical activity Advanced directives List of other physicians Hospitalizations, surgeries, and ER visits in previous 12 months Vitals Screenings to include cognitive, depression, and falls Referrals and appointments  In addition, I have reviewed and discussed with patient certain preventive protocols, quality metrics, and best practice recommendations. A written personalized care plan for preventive services as well as general preventive health recommendations were provided to patient.  Bruno Capri, LPN   0/98/1191   After Visit Summary: (In Person-Printed) AVS printed and given to the  patient  Notes: Nothing significant to report at this time.

## 2023-05-23 NOTE — Patient Instructions (Signed)
 Tammy Ramos , Thank you for taking time to come for your Medicare Wellness Visit. I appreciate your ongoing commitment to your health goals. Please review the following plan we discussed and let me know if I can assist you in the future.   Referrals/Orders/Follow-Ups/Clinician Recommendations: maintain health and activity   This is a list of the screening recommended for you and due dates:  Health Maintenance  Topic Date Due   COVID-19 Vaccine (4 - 2024-25 season) 10/03/2022   Colon Cancer Screening  08/28/2023   Flu Shot  09/02/2023   Medicare Annual Wellness Visit  05/22/2024   Mammogram  02/09/2025   Pneumonia Vaccine  Completed   DEXA scan (bone density measurement)  Completed   Hepatitis C Screening  Completed   Zoster (Shingles) Vaccine  Completed   HPV Vaccine  Aged Out   Meningitis B Vaccine  Aged Out   DTaP/Tdap/Td vaccine  Discontinued    Advanced directives: (Declined) Advance directive discussed with you today. Even though you declined this today, please call our office should you change your mind, and we can give you the proper paperwork for you to fill out.  Next Medicare Annual Wellness Visit scheduled for next year: Yes

## 2023-05-25 DIAGNOSIS — K080 Exfoliation of teeth due to systemic causes: Secondary | ICD-10-CM | POA: Diagnosis not present

## 2023-05-26 DIAGNOSIS — K080 Exfoliation of teeth due to systemic causes: Secondary | ICD-10-CM | POA: Diagnosis not present

## 2023-06-01 DIAGNOSIS — K080 Exfoliation of teeth due to systemic causes: Secondary | ICD-10-CM | POA: Diagnosis not present

## 2023-06-07 ENCOUNTER — Encounter: Payer: Self-pay | Admitting: Gastroenterology

## 2023-06-29 ENCOUNTER — Other Ambulatory Visit: Payer: Self-pay

## 2023-06-29 MED ORDER — EZETIMIBE 10 MG PO TABS: 10.0000 mg | ORAL_TABLET | Freq: Every day | ORAL | 3 refills | Status: AC

## 2023-06-29 MED ORDER — ATORVASTATIN CALCIUM 80 MG PO TABS: ORAL_TABLET | ORAL | 3 refills | Status: AC

## 2023-07-05 DIAGNOSIS — I639 Cerebral infarction, unspecified: Secondary | ICD-10-CM | POA: Insufficient documentation

## 2023-07-14 ENCOUNTER — Ambulatory Visit (AMBULATORY_SURGERY_CENTER)

## 2023-07-14 ENCOUNTER — Encounter: Payer: Self-pay | Admitting: Gastroenterology

## 2023-07-14 VITALS — Ht 61.0 in | Wt 137.2 lb

## 2023-07-14 DIAGNOSIS — Z1211 Encounter for screening for malignant neoplasm of colon: Secondary | ICD-10-CM

## 2023-07-14 MED ORDER — NA SULFATE-K SULFATE-MG SULF 17.5-3.13-1.6 GM/177ML PO SOLN
1.0000 | Freq: Once | ORAL | 0 refills | Status: AC
Start: 2023-07-14 — End: 2023-07-14

## 2023-07-14 NOTE — Progress Notes (Signed)

## 2023-07-21 ENCOUNTER — Ambulatory Visit (INDEPENDENT_AMBULATORY_CARE_PROVIDER_SITE_OTHER): Payer: Medicare Other | Admitting: Family Medicine

## 2023-07-21 ENCOUNTER — Encounter: Payer: Self-pay | Admitting: Family Medicine

## 2023-07-21 VITALS — BP 115/71 | HR 97 | Temp 98.1°F | Ht 61.0 in | Wt 137.4 lb

## 2023-07-21 DIAGNOSIS — I10 Essential (primary) hypertension: Secondary | ICD-10-CM

## 2023-07-21 DIAGNOSIS — R600 Localized edema: Secondary | ICD-10-CM | POA: Diagnosis not present

## 2023-07-21 DIAGNOSIS — R7303 Prediabetes: Secondary | ICD-10-CM

## 2023-07-21 DIAGNOSIS — E785 Hyperlipidemia, unspecified: Secondary | ICD-10-CM

## 2023-07-21 LAB — CBC
HCT: 38.1 % (ref 36.0–46.0)
Hemoglobin: 13.1 g/dL (ref 12.0–15.0)
MCHC: 34.4 g/dL (ref 30.0–36.0)
MCV: 87.4 fl (ref 78.0–100.0)
Platelets: 254 10*3/uL (ref 150.0–400.0)
RBC: 4.35 Mil/uL (ref 3.87–5.11)
RDW: 13.1 % (ref 11.5–15.5)
WBC: 4.6 10*3/uL (ref 4.0–10.5)

## 2023-07-21 LAB — COMPREHENSIVE METABOLIC PANEL WITH GFR
ALT: 43 U/L — ABNORMAL HIGH (ref 0–35)
AST: 25 U/L (ref 0–37)
Albumin: 4.8 g/dL (ref 3.5–5.2)
Alkaline Phosphatase: 60 U/L (ref 39–117)
BUN: 16 mg/dL (ref 6–23)
CO2: 30 meq/L (ref 19–32)
Calcium: 9.9 mg/dL (ref 8.4–10.5)
Chloride: 101 meq/L (ref 96–112)
Creatinine, Ser: 0.81 mg/dL (ref 0.40–1.20)
GFR: 73.44 mL/min (ref >60.00)
Glucose, Bld: 210 mg/dL — ABNORMAL HIGH (ref 70–99)
Potassium: 3.9 meq/L (ref 3.5–5.1)
Sodium: 137 meq/L (ref 135–145)
Total Bilirubin: 0.7 mg/dL (ref 0.2–1.2)
Total Protein: 7.9 g/dL (ref 6.0–8.3)

## 2023-07-21 LAB — LIPID PANEL
Cholesterol: 161 mg/dL (ref 0–200)
HDL: 53.7 mg/dL (ref >39.00)
LDL Cholesterol: 63 mg/dL (ref 0–99)
NonHDL: 107.19
Total CHOL/HDL Ratio: 3
Triglycerides: 221 mg/dL — ABNORMAL HIGH (ref 0.0–149.0)
VLDL: 44.2 mg/dL — ABNORMAL HIGH (ref 0.0–40.0)

## 2023-07-21 LAB — HEMOGLOBIN A1C: Hgb A1c MFr Bld: 6.4 % (ref 4.6–6.5)

## 2023-07-21 NOTE — Patient Instructions (Signed)
 It was very nice to see you today!  We will check labs.  No medication changes today.  Keep up the great work!  Return in about 6 months (around 01/20/2024) for Annual Physical.   Take care, Dr Daneil Dunker  PLEASE NOTE:  If you had any lab tests, please let us  know if you have not heard back within a few days. You may see your results on mychart before we have a chance to review them but we will give you a call once they are reviewed by us .   If we ordered any referrals today, please let us  know if you have not heard from their office within the next week.   If you had any urgent prescriptions sent in today, please check with the pharmacy within an hour of our visit to make sure the prescription was transmitted appropriately.   Please try these tips to maintain a healthy lifestyle:  Eat at least 3 REAL meals and 1-2 snacks per day.  Aim for no more than 5 hours between eating.  If you eat breakfast, please do so within one hour of getting up.   Each meal should contain half fruits/vegetables, one quarter protein, and one quarter carbs (no bigger than a computer mouse)  Cut down on sweet beverages. This includes juice, soda, and sweet tea.   Drink at least 1 glass of water with each meal and aim for at least 8 glasses per day  Exercise at least 150 minutes every week.

## 2023-07-21 NOTE — Assessment & Plan Note (Signed)
 No red flags.  Likely due to venous insufficiency though amlodipine  likely contributing as well.  We discussed conservative measures including salt avoidance, leg elevation, and compression stockings.  She will let us  know if symptoms worsen and would consider switching amlodipine  to alternative antihypertensive.

## 2023-07-21 NOTE — Progress Notes (Signed)
   Tammy Ramos is a 73 y.o. female who presents today for an office visit.  Assessment/Plan:  Chronic Problems Addressed Today: Leg edema No red flags.  Likely due to venous insufficiency though amlodipine  likely contributing as well.  We discussed conservative measures including salt avoidance, leg elevation, and compression stockings.  She will let us  know if symptoms worsen and would consider switching amlodipine  to alternative antihypertensive.  Essential hypertension At goal today on amlodipine  10 mg daily though is having some lower extremity edema that we are addressing as above.  Will continue with current dose of amlodipine  for now though may need to switch to alternative at some point in the future.  Prediabetes Check A1c.  She is doing well with metformin  750 mg daily.  Dyslipidemia Check lipids.  Doing well Lipitor 80 mg daily, Zetia  10 mg daily, and fenofibrate  145 mg daily.     Subjective:  HPI:  See Assessment / plan for status of chronic conditions. She is here today for follow up. She has no acute concerns today.  She has noticed more swelling in her legs the last several months.  No pain noted.  No reported chest pain or shortness of breath.  Worse towards the end of the day.  History provided by patient today via [place] interpreter.       Objective:  Physical Exam: BP 115/71   Pulse 97   Temp 98.1 F (36.7 C) (Temporal)   Ht 5' 1 (1.549 m)   Wt 137 lb 6.4 oz (62.3 kg)   SpO2 97%   BMI 25.96 kg/m   Wt Readings from Last 3 Encounters:  07/21/23 137 lb 6.4 oz (62.3 kg)  07/14/23 137 lb 3.2 oz (62.2 kg)  05/23/23 137 lb 12.8 oz (62.5 kg)    Gen: No acute distress, resting comfortably CV: Regular rate and rhythm with no murmurs appreciated Pulm: Normal work of breathing, clear to auscultation bilaterally with no crackles, wheezes, or rhonchi MUSCULOSKELETAL: Several scattered varicosities noted lower extremities.  Trace pretibial edema  bilaterally. Neuro: Grossly normal, moves all extremities Psych: Normal affect and thought content      Taivon Haroon M. Daneil Dunker, MD 07/21/2023 10:21 AM

## 2023-07-21 NOTE — Assessment & Plan Note (Signed)
 Check lipids.  Doing well Lipitor 80 mg daily, Zetia  10 mg daily, and fenofibrate  145 mg daily.

## 2023-07-21 NOTE — Assessment & Plan Note (Signed)
 Check A1c.  She is doing well with metformin  750 mg daily.

## 2023-07-21 NOTE — Assessment & Plan Note (Signed)
 At goal today on amlodipine  10 mg daily though is having some lower extremity edema that we are addressing as above.  Will continue with current dose of amlodipine  for now though may need to switch to alternative at some point in the future.

## 2023-07-22 ENCOUNTER — Ambulatory Visit: Payer: Self-pay | Admitting: Family Medicine

## 2023-07-22 NOTE — Progress Notes (Signed)
 Her labs are all stable.  A1c is up slightly but still at goal.  We should see her back in 6 months to recheck her A1c.  We can recheck everything else in 6 to 12 months.

## 2023-08-04 ENCOUNTER — Ambulatory Visit: Admitting: Gastroenterology

## 2023-08-04 ENCOUNTER — Encounter: Payer: Self-pay | Admitting: Gastroenterology

## 2023-08-04 VITALS — BP 119/73 | HR 65 | Temp 97.7°F | Resp 17 | Ht 61.0 in | Wt 137.2 lb

## 2023-08-04 DIAGNOSIS — D123 Benign neoplasm of transverse colon: Secondary | ICD-10-CM

## 2023-08-04 DIAGNOSIS — Z1211 Encounter for screening for malignant neoplasm of colon: Secondary | ICD-10-CM

## 2023-08-04 DIAGNOSIS — D120 Benign neoplasm of cecum: Secondary | ICD-10-CM | POA: Diagnosis not present

## 2023-08-04 DIAGNOSIS — K640 First degree hemorrhoids: Secondary | ICD-10-CM

## 2023-08-04 DIAGNOSIS — K635 Polyp of colon: Secondary | ICD-10-CM | POA: Diagnosis not present

## 2023-08-04 DIAGNOSIS — D125 Benign neoplasm of sigmoid colon: Secondary | ICD-10-CM

## 2023-08-04 DIAGNOSIS — Z860100 Personal history of colon polyps, unspecified: Secondary | ICD-10-CM | POA: Diagnosis not present

## 2023-08-04 MED ORDER — SODIUM CHLORIDE 0.9 % IV SOLN: 500.0000 mL | Freq: Once | INTRAVENOUS | Status: DC

## 2023-08-04 NOTE — Op Note (Addendum)
 [facility] Patient Name: Tammy Ramos Procedure Date: 08/04/2023 10:06 AM MRN: [id number] Endoscopist: Lynnie Bring , MD, [id number] Age: 73 Referring MD:  Date of Birth: 1950-02-12 Gender: Female Account #: [id number] Procedure:                Colonoscopy Indications:              High risk colon cancer surveillance: Personal                            history of colonic polyps Medicines:                Monitored Anesthesia Care Procedure:                Pre-Anesthesia Assessment:                           - Prior to the procedure, a History and Physical                            was performed, and patient medications and                            allergies were reviewed. The patient's tolerance of                            previous anesthesia was also reviewed. The risks                            and benefits of the procedure and the sedation                            options and risks were discussed with the patient.                            All questions were answered, and informed consent                            was obtained. Prior Anticoagulants: The patient has                            taken no anticoagulant or antiplatelet agents. ASA                            Grade Assessment: II - A patient with mild systemic                            disease. After reviewing the risks and benefits,                            the patient was deemed in satisfactory condition to                            undergo the procedure.  After obtaining informed consent, the colonoscope                            was passed under direct vision. Throughout the                            procedure, the patient's blood pressure, pulse, and                            oxygen saturations were monitored continuously. The                            PCF-H190TL Slim SN [id number], switched to PCF H190                            (regular peds scope) was introduced  through the                            anus and advanced to the the cecum, identified by                            appendiceal orifice and ileocecal valve. The                            colonoscopy was performed without difficulty. The                            patient tolerated the procedure well. The quality                            of the bowel preparation was good. The ileocecal                            valve, appendiceal orifice, and rectum were                            photographed. The Olympus Scope [id number] was                            introduced through the and advanced to the. Scope In: 10:25:16 AM Scope Out: 10:58:11 AM Scope Withdrawal Time: 0 hours 13 minutes 5 seconds  Total Procedure Duration: 0 hours 32 minutes 55 seconds  Findings:                 Two sessile polyps were found in the mid transverse                            colon and cecum. The polyps were 2 to 3 mm in size.                            These polyps were removed with a cold snare.  Resection and retrieval were complete.                           A 6 mm polyp was found in the distal sigmoid colon.                            The polyp was sessile. The polyp was removed with a                            cold snare. Resection and retrieval were complete.                           Non-bleeding internal hemorrhoids were found during                            retroflexion. The hemorrhoids were small and Grade                            I (internal hemorrhoids that do not prolapse).                           The exam was otherwise without abnormality on                            direct and retroflexion views. Complications:            No immediate complications. Estimated Blood Loss:     Estimated blood loss: none. Impression:               - Two 2 to 3 mm polyps in the mid transverse colon                            and in the cecum, removed with a cold snare.                             Resected and retrieved.                           - One 6 mm polyp in the distal sigmoid colon,                            removed with a cold snare. Resected and retrieved.                           - Non-bleeding internal hemorrhoids.                           - The examination was otherwise normal on direct                            and retroflexion views. Recommendation:           - Patient has a contact number available for  emergencies. The signs and symptoms of potential                            delayed complications were discussed with the                            patient. Return to normal activities tomorrow.                            Written discharge instructions were provided to the                            patient.                           - Resume previous diet.                           - Continue present medications.                           - Await pathology results.                           - Repeat colonoscopy for surveillance based on                            pathology results.                           - The findings and recommendations were discussed                            with the patient's family. Lynnie Bring, MD 08/04/2023 11:03:46 AM This report has been signed electronically.

## 2023-08-04 NOTE — Patient Instructions (Addendum)
 Resume previous diet Continue present medications Await pathology results Handouts/information given for polyps and hemorrhoids  YOU HAD AN ENDOSCOPIC PROCEDURE TODAY AT THE [facility]:   Refer to the procedure report that was given to you for any specific questions about what was found during the examination.  If the procedure report does not answer your questions, please call your gastroenterologist to clarify.  If you requested that your care partner not be given the details of your procedure findings, then the procedure report has been included in a sealed envelope for you to review at your convenience later.  YOU SHOULD EXPECT: Some feelings of bloating in the abdomen. Passage of more gas than usual.  Walking can help get rid of the air that was put into your GI tract during the procedure and reduce the bloating. If you had a lower endoscopy (such as a colonoscopy or flexible sigmoidoscopy) you may notice spotting of blood in your stool or on the toilet paper. If you underwent a bowel prep for your procedure, you may not have a normal bowel movement for a few days.  Please Note:  You might notice some irritation and congestion in your nose or some drainage.  This is from the oxygen used during your procedure.  There is no need for concern and it should clear up in a day or so.  SYMPTOMS TO REPORT IMMEDIATELY:  Following lower endoscopy (colonoscopy):  Excessive amounts of blood in the stool  Significant tenderness or worsening of abdominal pains  Swelling of the abdomen that is new, acute  Fever of 100F or higher  For urgent or emergent issues, a gastroenterologist can be reached at any hour by calling [phone]. Do not use MyChart messaging for urgent concerns.   DIET:  We do recommend a small meal at first, but then you may proceed to your regular diet.  Drink plenty of fluids but you should avoid alcoholic beverages for 24 hours.  ACTIVITY:  You should plan to take  it easy for the rest of today and you should NOT DRIVE or use heavy machinery until tomorrow (because of the sedation medicines used during the test).    FOLLOW UP: Our staff will call the number listed on your records the next business day following your procedure.  We will call around 7:15- 8:00 am to check on you and address any questions or concerns that you may have regarding the information given to you following your procedure. If we do not reach you, we will leave a message.     If any biopsies were taken you will be contacted by phone or by letter within the next 1-3 weeks.  Please call us at [phone] if you have not heard about the biopsies in 3 weeks.    SIGNATURES/CONFIDENTIALITY: You and/or your care partner have signed paperwork which will be entered into your electronic medical record.  These signatures attest to the fact that that the information above on your After Visit Summary has been reviewed and is understood.  Full responsibility of the confidentiality of this discharge information lies with you and/or your care-partner.

## 2023-08-04 NOTE — Progress Notes (Signed)
 Interpreter used today at the [facility] for this pt.  Interpreter's name is- Leeroy  Pt's states no medical or surgical changes since previsit or office visit.

## 2023-08-04 NOTE — Progress Notes (Signed)
 Called to room to assist during endoscopic procedure.  Patient ID and intended procedure confirmed with present staff. Received instructions for my participation in the procedure from the performing physician.

## 2023-08-04 NOTE — Progress Notes (Signed)
 Report to PACU, RN, vss, BBS= Clear.

## 2023-08-04 NOTE — Progress Notes (Signed)
 [facility] History and Physical   Primary Care Physician:  Kennyth Worth HERO, MD   Reason for Procedure:   H/O polyps  Plan:    colon     HPI: Tammy Ramos is a 73 y.o. female    Past Medical History:  Diagnosis Date   Diabetes mellitus without complication (HCC)    High cholesterol    Hypertension    Stroke [facility])    2019    Past Surgical History:  Procedure Laterality Date   HEMORRHOID SURGERY      Prior to Admission medications   Medication Sig Start Date End Date Taking? Authorizing Provider  amLODipine  (NORVASC ) 10 MG tablet TAKE 1 TABLET BY MOUTH EVERY DAY 09/29/22  Yes Parker, Caleb M, MD  aspirin 81 MG chewable tablet Chew by mouth daily.   Yes [provider]  atorvastatin  (LIPITOR) 80 MG tablet TAKE 1 TABLET BY MOUTH EVERYDAY AT BEDTIME 06/29/23  Yes Agbor-Etang, Redell, MD  ezetimibe  (ZETIA ) 10 MG tablet Take 1 tablet (10 mg total) by mouth daily. 06/29/23 09/27/23 Yes Agbor-Etang, Redell, MD  fenofibrate  (TRICOR ) 145 MG tablet Take 1 tablet (145 mg total) by mouth daily. 04/06/23  Yes Agbor-Etang, Redell, MD  metFORMIN  (GLUCOPHAGE -XR) 750 MG 24 hr tablet TAKE 1 TABLET BY MOUTH EVERY DAY WITH BREAKFAST 02/03/23  Yes Kennyth Worth HERO, MD  pantoprazole  (PROTONIX ) 40 MG tablet TAKE 1 TABLET BY MOUTH EVERY DAY IN THE MORNING 09/21/21  Yes Eda Iha, MD    Current Outpatient Medications  Medication Sig Dispense Refill   amLODipine  (NORVASC ) 10 MG tablet TAKE 1 TABLET BY MOUTH EVERY DAY 90 tablet 3   aspirin 81 MG chewable tablet Chew by mouth daily.     atorvastatin  (LIPITOR) 80 MG tablet TAKE 1 TABLET BY MOUTH EVERYDAY AT BEDTIME 90 tablet 3   ezetimibe  (ZETIA ) 10 MG tablet Take 1 tablet (10 mg total) by mouth daily. 90 tablet 3   fenofibrate  (TRICOR ) 145 MG tablet Take 1 tablet (145 mg total) by mouth daily. 90 tablet 3   metFORMIN  (GLUCOPHAGE -XR) 750 MG 24 hr tablet TAKE 1 TABLET BY MOUTH EVERY DAY WITH BREAKFAST 90 tablet 3   pantoprazole   (PROTONIX ) 40 MG tablet TAKE 1 TABLET BY MOUTH EVERY DAY IN THE MORNING 90 tablet 3   No current facility-administered medications for this visit.    Allergies as of 08/04/2023   (No Known Allergies)    Family History  Problem Relation Age of Onset   Heart disease Brother    Heart failure Brother    Heart attack Neg Hx    Stroke Neg Hx    Colon cancer Neg Hx    Colon polyps Neg Hx    Esophageal cancer Neg Hx    Rectal cancer Neg Hx    Stomach cancer Neg Hx     Social History   Socioeconomic History   Marital status: Married    Spouse name: Not on file   Number of children: 1   Years of education: Not on file   Highest education level: Not on file  Occupational History   Not on file  Tobacco Use   Smoking status: Never    Passive exposure: Never   Smokeless tobacco: Never  Vaping Use   Vaping status: Never Used  Substance and Sexual Activity   Alcohol use: Never   Drug use: Never   Sexual activity: Not on file  Other Topics Concern   Not on file  Social History  Narrative   Not on file   Social Drivers of Health   Financial Resource Strain: Low Risk  (05/23/2023)   Overall Financial Resource Strain (CARDIA)    Difficulty of Paying Living Expenses: Not hard at all  Food Insecurity: No Food Insecurity (05/23/2023)   Hunger Vital Sign    Worried About Running Out of Food in the Last Year: Never true    Ran Out of Food in the Last Year: Never true  Transportation Needs: No Transportation Needs (05/23/2023)   PRAPARE - Administrator, Civil Service (Medical): No    Lack of Transportation (Non-Medical): No  Physical Activity: Sufficiently Active (05/23/2023)   Exercise Vital Sign    Days of Exercise per Week: 5 days    Minutes of Exercise per Session: 30 min  Stress: No Stress Concern Present (05/23/2023)   [employer] of Occupational Health - Occupational Stress Questionnaire    Feeling of Stress : Not at all  Social Connections: Moderately  Isolated (05/23/2023)   Social Connection and Isolation Panel    Frequency of Communication with Friends and Family: More than three times a week    Frequency of Social Gatherings with Friends and Family: More than three times a week    Attends Religious Services: Never    Database administrator or Organizations: No    Attends Banker Meetings: Never    Marital Status: Married  Catering manager Violence: Not At Risk (05/23/2023)   Humiliation, Afraid, Rape, and Kick questionnaire    Fear of Current or Ex-Partner: No    Emotionally Abused: No    Physically Abused: No    Sexually Abused: No    Review of Systems: Positive for none All other review of systems negative except as mentioned in the HPI.  Physical Exam: Vital signs in last 24 hours: @VSRANGES @   General:   Alert,  Well-developed, well-nourished, pleasant and cooperative in NAD Lungs:  Clear throughout to auscultation.   Heart:  Regular rate and rhythm; no murmurs, clicks, rubs,  or gallops. Abdomen:  Soft, nontender and nondistended. Normal bowel sounds.   Neuro/Psych:  Alert and cooperative. Normal mood and affect. A and O x 3    No significant changes were identified.  The patient continues to be an appropriate candidate for the planned procedure and anesthesia.   Anselm Bring, MD. [facility] 08/04/2023 12:14 PM@

## 2023-08-08 ENCOUNTER — Telehealth: Payer: Self-pay | Admitting: *Deleted

## 2023-08-08 NOTE — Telephone Encounter (Signed)
 Post procedure follow up call placed no answer and left VM.

## 2023-08-11 LAB — SURGICAL PATHOLOGY

## 2023-08-18 ENCOUNTER — Ambulatory Visit: Payer: Self-pay | Admitting: Gastroenterology

## 2023-11-07 DIAGNOSIS — K080 Exfoliation of teeth due to systemic causes: Secondary | ICD-10-CM | POA: Diagnosis not present

## 2023-11-09 DIAGNOSIS — K080 Exfoliation of teeth due to systemic causes: Secondary | ICD-10-CM | POA: Diagnosis not present

## 2023-11-14 DIAGNOSIS — K080 Exfoliation of teeth due to systemic causes: Secondary | ICD-10-CM | POA: Diagnosis not present

## 2023-11-15 DIAGNOSIS — K080 Exfoliation of teeth due to systemic causes: Secondary | ICD-10-CM | POA: Diagnosis not present

## 2023-11-29 DIAGNOSIS — K080 Exfoliation of teeth due to systemic causes: Secondary | ICD-10-CM | POA: Diagnosis not present

## 2023-12-08 DIAGNOSIS — K080 Exfoliation of teeth due to systemic causes: Secondary | ICD-10-CM | POA: Diagnosis not present

## 2023-12-21 DIAGNOSIS — K080 Exfoliation of teeth due to systemic causes: Secondary | ICD-10-CM | POA: Diagnosis not present

## 2023-12-24 ENCOUNTER — Other Ambulatory Visit: Payer: Self-pay | Admitting: Family Medicine

## 2024-01-10 DIAGNOSIS — K080 Exfoliation of teeth due to systemic causes: Secondary | ICD-10-CM | POA: Diagnosis not present

## 2024-01-12 NOTE — Progress Notes (Signed)
 Tammy Ramos                                          MRN: [id number]   01/12/2024   The VBCI Quality Team Specialist reviewed this patient medical record for the purposes of chart review for care gap closure. The following were reviewed: chart review for care gap closure-kidney health evaluation for diabetes:eGFR  and uACR.    VBCI Quality Team

## 2024-01-13 ENCOUNTER — Ambulatory Visit: Attending: Cardiology | Admitting: Cardiology

## 2024-01-13 VITALS — BP 132/80 | HR 66 | Ht 61.0 in | Wt 133.6 lb

## 2024-01-13 DIAGNOSIS — I2510 Atherosclerotic heart disease of native coronary artery without angina pectoris: Secondary | ICD-10-CM | POA: Diagnosis not present

## 2024-01-13 DIAGNOSIS — I10 Essential (primary) hypertension: Secondary | ICD-10-CM | POA: Diagnosis not present

## 2024-01-13 DIAGNOSIS — E782 Mixed hyperlipidemia: Secondary | ICD-10-CM | POA: Diagnosis not present

## 2024-01-13 NOTE — Patient Instructions (Signed)
 Medication Instructions:  Your physician recommends that you continue on your current medications as directed. Please refer to the Current Medication list given to you today.   *If you need a refill on your cardiac medications before your next appointment, please call your pharmacy*  Lab Work: Your provider would like for you to return in 1 week to have the following labs drawn: fasting lipid panel.   Please go to [facility] [address] ([facility]) [address], [place] [address] You do not need an appointment.  They are open from 8 am- 4:30 pm.  Lunch from 1:00 pm- 2:00 pm You do need to be fasting.   If you have labs (blood work) drawn today and your tests are completely normal, you will receive your results only by: MyChart Message (if you have MyChart) OR A paper copy in the mail If you have any lab test that is abnormal or we need to change your treatment, we will call you to review the results.  Testing/Procedures: No test ordered today   Follow-Up: At [facility], you and your health needs are our priority.  As part of our continuing mission to provide you with exceptional heart care, our providers are all part of one team.  This team includes your primary Cardiologist (physician) and Advanced Practice Providers or APPs (Physician Assistants and Nurse Practitioners) who all work together to provide you with the care you need, when you need it.  Your next appointment:   1 year(s)  Provider:   You may see Redell Cave, MD or one of the following Advanced Practice Providers on your designated Care Team:   Lonni Meager, NP Lesley Maffucci, PA-C Bernardino Bring, PA-C Cadence [place], PA-C Tylene Lunch, NP Barnie Hila, NP    We recommend signing up for the patient portal called MyChart.  Sign up information is provided on this After Visit Summary.  MyChart is used to connect with patients for Virtual Visits (Telemedicine).  Patients are  able to view lab/test results, encounter notes, upcoming appointments, etc.  Non-urgent messages can be sent to your provider as well.   To learn more about what you can do with MyChart, go to [email or web address].

## 2024-01-13 NOTE — Progress Notes (Signed)
 Cardiology Office Note:    Date:  01/13/2024   ID:  Tammy Ramos, DOB [birth year], MRN [id number]  PCP:  Kennyth Worth HERO, MD    HeartCare Providers Cardiologist:  Redell Cave, MD     Referring MD: Kennyth Worth HERO, MD   Chief Complaint  Patient presents with   Follow-up    7 month follow up visit. Meds reviewed.    History of Present Illness:    Tammy Ramos is a 73 y.o. female with a hx of mild nonobstructive CAD, hyperlipidemia, hypertension, CVA 2018 who presents for follow-up.  Being seen due to hyperlipidemia, triglycerides previously elevated.  Started on fenofibrate , denies any adverse symptoms.  Also takes and compliant with Zetia , Lipitor.  Repatha  not approved by the [employer] company.  Patient plans to change insurance company in about a month, hoping new insurance company covers Repatha .  Otherwise doing okay from the cardiac perspective, no new concerns.  Prior notes/studies Coronary CT 7/24 mild nonobstructive CAD in LAD, left circumflex, RCA 25 to 49% Echo 7/24 EF 55 to 60% brother had two-vessel CABG in his 74s.  Parents died when she was very young, no known cardiac history.  Had a history of aphasia 6 years ago, diagnosed with a stroke.  Past Medical History:  Diagnosis Date   Diabetes mellitus without complication (HCC)    High cholesterol    Hypertension    Stroke (HCC)    2019    Past Surgical History:  Procedure Laterality Date   HEMORRHOID SURGERY      Current Medications: Current Meds  Medication Sig   amLODipine  (NORVASC ) 10 MG tablet TAKE 1 TABLET BY MOUTH EVERY DAY   aspirin 81 MG chewable tablet Chew by mouth daily.   atorvastatin  (LIPITOR) 80 MG tablet TAKE 1 TABLET BY MOUTH EVERYDAY AT BEDTIME   ezetimibe  (ZETIA ) 10 MG tablet Take 1 tablet (10 mg total) by mouth daily.   fenofibrate  (TRICOR ) 145 MG tablet Take 1 tablet (145 mg total) by mouth daily.   metFORMIN  (GLUCOPHAGE -XR) 750 MG 24 hr tablet TAKE 1 TABLET BY MOUTH  EVERY DAY WITH BREAKFAST   pantoprazole  (PROTONIX ) 40 MG tablet TAKE 1 TABLET BY MOUTH EVERY DAY IN THE MORNING     Allergies:   Patient has no known allergies.   Social History   Socioeconomic History   Marital status: Married    Spouse name: Not on file   Number of children: 1   Years of education: Not on file   Highest education level: Not on file  Occupational History   Not on file  Tobacco Use   Smoking status: Never    Passive exposure: Never   Smokeless tobacco: Never  Vaping Use   Vaping status: Never Used  Substance and Sexual Activity   Alcohol use: Never   Drug use: Never   Sexual activity: Not on file  Other Topics Concern   Not on file  Social History Narrative   Not on file   Social Drivers of Health   Tobacco Use: Low Risk (08/04/2023)   Patient History    Smoking Tobacco Use: Never    Smokeless Tobacco Use: Never    Passive Exposure: Never  Financial Resource Strain: Low Risk (05/23/2023)   Overall Financial Resource Strain (CARDIA)    Difficulty of Paying Living Expenses: Not hard at all  Food Insecurity: No Food Insecurity (05/23/2023)   Hunger Vital Sign    Worried About Running Out of Food in the  Last Year: Never true    Ran Out of Food in the Last Year: Never true  Transportation Needs: No Transportation Needs (05/23/2023)   PRAPARE - Administrator, Civil Service (Medical): No    Lack of Transportation (Non-Medical): No  Physical Activity: Sufficiently Active (05/23/2023)   Exercise Vital Sign    Days of Exercise per Week: 5 days    Minutes of Exercise per Session: 30 min  Stress: No Stress Concern Present (05/23/2023)   [employer] of Occupational Health - Occupational Stress Questionnaire    Feeling of Stress : Not at all  Social Connections: Moderately Isolated (05/23/2023)   Social Connection and Isolation Panel    Frequency of Communication with Friends and Family: More than three times a week    Frequency of Social  Gatherings with Friends and Family: More than three times a week    Attends Religious Services: Never    Database Administrator or Organizations: No    Attends Banker Meetings: Never    Marital Status: Married  Depression (PHQ2-9): Low Risk (07/21/2023)   Depression (PHQ2-9)    PHQ-2 Score: 0  Alcohol Screen: Low Risk (05/23/2023)   Alcohol Screen    Last Alcohol Screening Score (AUDIT): 0  Housing: Unknown (05/23/2023)   Housing Stability Vital Sign    Unable to Pay for Housing in the Last Year: No    Number of Times Moved in the Last Year: Not on file    Homeless in the Last Year: No  Utilities: Not At Risk (05/23/2023)   AHC Utilities    Threatened with loss of utilities: No  Health Literacy: Adequate Health Literacy (05/23/2023)   B1300 Health Literacy    Frequency of need for help with medical instructions: Never     Family History: The patient's family history includes Heart disease in her brother; Heart failure in her brother. There is no history of Heart attack, Stroke, Colon cancer, Colon polyps, Esophageal cancer, Rectal cancer, or Stomach cancer.  ROS:   Please see the history of present illness.     All other systems reviewed and are negative.  EKGs/Labs/Other Studies Reviewed:    The following studies were reviewed today:   EKG Interpretation Date/Time:  Friday January 13 2024 10:49:27 EST Ventricular Rate:  66 PR Interval:  192 QRS Duration:  80 QT Interval:  390 QTC Calculation: 408 R Axis:   89  Text Interpretation: Normal sinus rhythm Possible Left atrial enlargement Confirmed by Darliss Rogue ([id number]) on 01/13/2024 11:10:29 AM    Recent Labs: 01/20/2023: TSH 1.27 07/21/2023: ALT 43; BUN 16; Creatinine, Ser 0.81; Hemoglobin 13.1; Platelets 254.0; Potassium 3.9; Sodium 137  Recent Lipid Panel    Component Value Date/Time   CHOL 161 07/21/2023 1019   CHOL 133 03/31/2023 0831   TRIG 221.0 (H) 07/21/2023 1019   HDL 53.70 07/21/2023  1019   HDL 53 03/31/2023 0831   CHOLHDL 3 07/21/2023 1019   VLDL 44.2 (H) 07/21/2023 1019   LDLCALC 63 07/21/2023 1019   LDLCALC 48 03/31/2023 0831   LDLCALC 115 (H) 01/07/2020 0834   LDLDIRECT 275.0 01/07/2022 1043     Risk Assessment/Calculations:             Physical Exam:    VS:  BP 132/80   Pulse 66   Ht 5' 1 (1.549 m)   Wt 133 lb 9.6 oz (60.6 kg)   SpO2 97%   BMI 25.24 kg/m  Wt Readings from Last 3 Encounters:  01/13/24 133 lb 9.6 oz (60.6 kg)  08/04/23 137 lb 3.2 oz (62.2 kg)  07/21/23 137 lb 6.4 oz (62.3 kg)     GEN:  Well nourished, well developed in no acute distress HEENT: Normal NECK: No JVD; No carotid bruits CARDIAC: RRR, no murmurs, rubs, gallops RESPIRATORY:  Clear to auscultation without rales, wheezing or rhonchi  ABDOMEN: Soft, non-tender, non-distended MUSCULOSKELETAL:  No edema; No deformity  SKIN: Warm and dry NEUROLOGIC:  Alert and oriented x 3 PSYCHIATRIC:  Normal affect   ASSESSMENT:    1. Coronary artery disease involving native coronary artery of native heart, unspecified whether angina present   2. Mixed hyperlipidemia   3. Primary hypertension    PLAN:    In order of problems listed above:  Mild nonobstructive CAD (25-49%), involving LAD, left circumflex, distal RCA.  EF 55 to 60%.  Denies chest pain.  Continue aspirin 81 mg daily, Lipitor 80. Hyperlipidemia, LDL, total cholesterol adequately controlled.  Triglycerides elevated.  Continue fenofibrate , Lipitor 80 mg daily, Zetia  10 mg daily.  Repeat lipid panel.  Insurance company declined covering Repatha . Hypertension, BP controlled, continue Norvasc  10 mg daily.  Follow-up in 12 months.        Medication Adjustments/Labs and Tests Ordered: Current medicines are reviewed at length with the patient today.  Concerns regarding medicines are outlined above.  Orders Placed This Encounter  Procedures   Lipid panel   EKG 12-Lead   No orders of the defined types were placed  in this encounter.   Patient Instructions  Medication Instructions:  Your physician recommends that you continue on your current medications as directed. Please refer to the Current Medication list given to you today.   *If you need a refill on your cardiac medications before your next appointment, please call your pharmacy*  Lab Work: Your provider would like for you to return in 1 week to have the following labs drawn: fasting lipid panel.   Please go to [facility] [address] ([facility]) [address], [place] [address] You do not need an appointment.  They are open from 8 am- 4:30 pm.  Lunch from 1:00 pm- 2:00 pm You do need to be fasting.   If you have labs (blood work) drawn today and your tests are completely normal, you will receive your results only by: MyChart Message (if you have MyChart) OR A paper copy in the mail If you have any lab test that is abnormal or we need to change your treatment, we will call you to review the results.  Testing/Procedures: No test ordered today   Follow-Up: At [facility], you and your health needs are our priority.  As part of our continuing mission to provide you with exceptional heart care, our providers are all part of one team.  This team includes your primary Cardiologist (physician) and Advanced Practice Providers or APPs (Physician Assistants and Nurse Practitioners) who all work together to provide you with the care you need, when you need it.  Your next appointment:   1 year(s)  Provider:   You may see Redell Cave, MD or one of the following Advanced Practice Providers on your designated Care Team:   Lonni Meager, NP Lesley Maffucci, PA-C Bernardino Bring, PA-C Cadence [place], PA-C Tylene Lunch, NP Barnie Hila, NP    We recommend signing up for the patient portal called MyChart.  Sign up information is provided on this After Visit Summary.  MyChart is used to connect with patients  for Virtual Visits (Telemedicine).  Patients are able to view lab/test results, encounter notes, upcoming appointments, etc.  Non-urgent messages can be sent to your provider as well.   To learn more about what you can do with MyChart, go to [email or web address].             Signed, Redell Cave, MD  01/13/2024 11:37 AM    [facility]

## 2024-01-17 DIAGNOSIS — E782 Mixed hyperlipidemia: Secondary | ICD-10-CM | POA: Diagnosis not present

## 2024-01-18 LAB — LIPID PANEL
Chol/HDL Ratio: 2.6 ratio (ref 0.0–4.4)
Cholesterol, Total: 134 mg/dL (ref 100–199)
HDL: 51 mg/dL (ref >39)
LDL Chol Calc (NIH): 67 mg/dL (ref 0–99)
Triglycerides: 84 mg/dL (ref 0–149)
VLDL Cholesterol Cal: 16 mg/dL (ref 5–40)

## 2024-01-24 ENCOUNTER — Ambulatory Visit: Payer: Self-pay | Admitting: Cardiology

## 2024-01-30 ENCOUNTER — Ambulatory Visit: Payer: Self-pay | Admitting: Family Medicine

## 2024-01-30 ENCOUNTER — Ambulatory Visit (INDEPENDENT_AMBULATORY_CARE_PROVIDER_SITE_OTHER): Admitting: Family Medicine

## 2024-01-30 VITALS — BP 120/68 | HR 64 | Temp 97.5°F | Ht 61.0 in | Wt 129.2 lb

## 2024-01-30 DIAGNOSIS — I10 Essential (primary) hypertension: Secondary | ICD-10-CM

## 2024-01-30 DIAGNOSIS — R7303 Prediabetes: Secondary | ICD-10-CM | POA: Diagnosis not present

## 2024-01-30 DIAGNOSIS — E785 Hyperlipidemia, unspecified: Secondary | ICD-10-CM

## 2024-01-30 DIAGNOSIS — E2839 Other primary ovarian failure: Secondary | ICD-10-CM | POA: Diagnosis not present

## 2024-01-30 DIAGNOSIS — Z0001 Encounter for general adult medical examination with abnormal findings: Secondary | ICD-10-CM | POA: Diagnosis not present

## 2024-01-30 DIAGNOSIS — M1990 Unspecified osteoarthritis, unspecified site: Secondary | ICD-10-CM | POA: Diagnosis not present

## 2024-01-30 LAB — COMPREHENSIVE METABOLIC PANEL WITH GFR
ALT: 33 U/L (ref 3–35)
AST: 31 U/L (ref 5–37)
Albumin: 5.1 g/dL (ref 3.5–5.2)
Alkaline Phosphatase: 31 U/L — ABNORMAL LOW (ref 39–117)
BUN: 21 mg/dL (ref 6–23)
CO2: 26 meq/L (ref 19–32)
Calcium: 9.8 mg/dL (ref 8.4–10.5)
Chloride: 102 meq/L (ref 96–112)
Creatinine, Ser: 0.88 mg/dL (ref 0.40–1.20)
GFR: 66.24 mL/min
Glucose, Bld: 99 mg/dL (ref 70–99)
Potassium: 3.9 meq/L (ref 3.5–5.1)
Sodium: 139 meq/L (ref 135–145)
Total Bilirubin: 0.7 mg/dL (ref 0.2–1.2)
Total Protein: 7.9 g/dL (ref 6.0–8.3)

## 2024-01-30 LAB — CBC
HCT: 37.7 % (ref 36.0–46.0)
Hemoglobin: 12.8 g/dL (ref 12.0–15.0)
MCHC: 33.9 g/dL (ref 30.0–36.0)
MCV: 88.6 fl (ref 78.0–100.0)
Platelets: 271 K/uL (ref 150.0–400.0)
RBC: 4.26 Mil/uL (ref 3.87–5.11)
RDW: 13.3 % (ref 11.5–15.5)
WBC: 3.6 K/uL — ABNORMAL LOW (ref 4.0–10.5)

## 2024-01-30 LAB — LIPID PANEL
Cholesterol: 163 mg/dL (ref 28–200)
HDL: 46.9 mg/dL
LDL Cholesterol: 90 mg/dL (ref 10–99)
NonHDL: 115.84
Total CHOL/HDL Ratio: 3
Triglycerides: 127 mg/dL (ref 10.0–149.0)
VLDL: 25.4 mg/dL (ref 0.0–40.0)

## 2024-01-30 LAB — TSH: TSH: 1.48 u[IU]/mL (ref 0.35–5.50)

## 2024-01-30 LAB — HEMOGLOBIN A1C: Hgb A1c MFr Bld: 6.2 % (ref 4.6–6.5)

## 2024-01-30 NOTE — Progress Notes (Signed)
 Her labs are all stable.  A1c is 6.2.  As we discussed at her visit that she can discontinue the metformin .  She should let us  know if she is continuing to lose weight over the next several weeks.  I will see her back in office in 3 to 6 months.

## 2024-01-30 NOTE — Assessment & Plan Note (Signed)
 She is on metformin  750 mg daily though she is concerned about weight loss over the last few months.  We are checking A1c with labs and may be able to discontinue depending on results of today's labs.

## 2024-01-30 NOTE — Assessment & Plan Note (Signed)
 Check lipids.  She is on Lipitor 80 mg daily, Zetia  10 mg daily, and metformin  145 mg daily.

## 2024-01-30 NOTE — Patient Instructions (Addendum)
 It was very nice to see you today!  VISIT SUMMARY: You came in for your annual physical exam. We discussed your recent weight loss, which is likely due to your medication, and your overall health maintenance.  YOUR PLAN: PREDIABETES: Your weight loss is likely due to the metformin  you are taking for blood sugar management. -Continue taking metformin  as prescribed to manage your blood sugar levels.  GENERAL HEALTH MAINTENANCE: We talked about the importance of exercise and weight management, especially during the summer months. -Aim for 30 minutes of daily exercise, particularly in the summer. -Stay active by working in the garden and walking with your husband.  Return in about 1 year (around 01/29/2025) for Annual Physical.   Take care, Dr Kennyth  PLEASE NOTE:  If you had any lab tests, please let us  know if you have not heard back within a few days. You may see your results on mychart before we have a chance to review them but we will give you a call once they are reviewed by us .   If we ordered any referrals today, please let us  know if you have not heard from their office within the next week.   If you had any urgent prescriptions sent in today, please check with the pharmacy within an hour of our visit to make sure the prescription was transmitted appropriately.   Please try these tips to maintain a healthy lifestyle:  Eat at least 3 REAL meals and 1-2 snacks per day.  Aim for no more than 5 hours between eating.  If you eat breakfast, please do so within one hour of getting up.   Each meal should contain half fruits/vegetables, one quarter protein, and one quarter carbs (no bigger than a computer mouse)  Cut down on sweet beverages. This includes juice, soda, and sweet tea.   Drink at least 1 glass of water with each meal and aim for at least 8 glasses per day  Exercise at least 150 minutes every week.    Preventive Care 63 Years and Older, Female Preventive care refers to  lifestyle choices and visits with your health care provider that can promote health and wellness. Preventive care visits are also called wellness exams. What can I expect for my preventive care visit? Counseling Your health care provider may ask you questions about your: Medical history, including: Past medical problems. Family medical history. Pregnancy and menstrual history. History of falls. Current health, including: Memory and ability to understand (cognition). Emotional well-being. Home life and relationship well-being. Sexual activity and sexual health. Lifestyle, including: Alcohol, nicotine or tobacco, and drug use. Access to firearms. Diet, exercise, and sleep habits. Work and work astronomer. Sunscreen use. Safety issues such as seatbelt and bike helmet use. Physical exam Your health care provider will check your: Height and weight. These may be used to calculate your BMI (body mass index). BMI is a measurement that tells if you are at a healthy weight. Waist circumference. This measures the distance around your waistline. This measurement also tells if you are at a healthy weight and may help predict your risk of certain diseases, such as type 2 diabetes and high blood pressure. Heart rate and blood pressure. Body temperature. Skin for abnormal spots. What immunizations do I need?  Vaccines are usually given at various ages, according to a schedule. Your health care provider will recommend vaccines for you based on your age, medical history, and lifestyle or other factors, such as travel or where you work. What  tests do I need? Screening Your health care provider may recommend screening tests for certain conditions. This may include: Lipid and cholesterol levels. Hepatitis C test. Hepatitis B test. HIV (human immunodeficiency virus) test. STI (sexually transmitted infection) testing, if you are at risk. Lung cancer screening. Colorectal cancer screening. Diabetes  screening. This is done by checking your blood sugar (glucose) after you have not eaten for a while (fasting). Mammogram. Talk with your health care provider about how often you should have regular mammograms. BRCA-related cancer screening. This may be done if you have a family history of breast, ovarian, tubal, or peritoneal cancers. Bone density scan. This is done to screen for osteoporosis. Talk with your health care provider about your test results, treatment options, and if necessary, the need for more tests. Follow these instructions at home: Eating and drinking  Eat a diet that includes fresh fruits and vegetables, whole grains, lean protein, and low-fat dairy products. Limit your intake of foods with high amounts of sugar, saturated fats, and salt. Take vitamin and mineral supplements as recommended by your health care provider. Do not drink alcohol if your health care provider tells you not to drink. If you drink alcohol: Limit how much you have to 0-1 drink a day. Know how much alcohol is in your drink. In the [place] one drink equals one 12 oz bottle of beer (355 mL), one 5 oz glass of wine (148 mL), or one 1 oz glass of hard liquor (44 mL). Lifestyle Brush your teeth every morning and night with fluoride toothpaste. Floss one time each day. Exercise for at least 30 minutes 5 or more days each week. Do not use any products that contain nicotine or tobacco. These products include cigarettes, chewing tobacco, and vaping devices, such as e-cigarettes. If you need help quitting, ask your health care provider. Do not use drugs. If you are sexually active, practice safe sex. Use a condom or other form of protection in order to prevent STIs. Take aspirin only as told by your health care provider. Make sure that you understand how much to take and what form to take. Work with your health care provider to find out whether it is safe and beneficial for you to take aspirin daily. Ask your health  care provider if you need to take a cholesterol-lowering medicine (statin). Find healthy ways to manage stress, such as: Meditation, yoga, or listening to music. Journaling. Talking to a trusted person. Spending time with friends and family. Minimize exposure to UV radiation to reduce your risk of skin cancer. Safety Always wear your seat belt while driving or riding in a vehicle. Do not drive: If you have been drinking alcohol. Do not ride with someone who has been drinking. When you are tired or distracted. While texting. If you have been using any mind-altering substances or drugs. Wear a helmet and other protective equipment during sports activities. If you have firearms in your house, make sure you follow all gun safety procedures. What's next? Visit your health care provider once a year for an annual wellness visit. Ask your health care provider how often you should have your eyes and teeth checked. Stay up to date on all vaccines. This information is not intended to replace advice given to you by your health care provider. Make sure you discuss any questions you have with your health care provider. Document Revised: 07/16/2020 Document Reviewed: 07/16/2020 Elsevier Patient Education  2024 [employer].

## 2024-01-30 NOTE — Progress Notes (Signed)
 "  Chief Complaint:  Tammy Ramos is a 73 y.o. female who presents today for her annual comprehensive physical exam.    Assessment/Plan:  New/Acute Problems: Weight Loss  Patient down 8 pounds since her last visit here 6 months ago.  No red flags or other symptoms.  This potentially may be related to her metformin .  Will check labs today and stop metformin  depending on results of her A1c.  We discussed reasons to return to care.  She will follow-up with us  in a few months if weight loss persists. Follow up in 6 months.   Chronic Problems Addressed Today: Essential hypertension At goal today on amlodipine  10 mg daily.  Dyslipidemia Check lipids.  She is on Lipitor 80 mg daily, Zetia  10 mg daily, and metformin  145 mg daily.  Prediabetes She is on metformin  750 mg daily though she is concerned about weight loss over the last few months.  We are checking A1c with labs and may be able to discontinue depending on results of today's labs.  Preventative Healthcare: Check labs.  Up-to-date on mammogram and colonoscopy.  Will order bone density scan.  Patient Counseling(The following topics were reviewed and/or handout was given):  -Nutrition: Stressed importance of moderation in sodium/caffeine intake, saturated fat and cholesterol, caloric balance, sufficient intake of fresh fruits, vegetables, and fiber.  -Stressed the importance of regular exercise.   -Substance Abuse: Discussed cessation/primary prevention of tobacco, alcohol, or other drug use; driving or other dangerous activities under the influence; availability of treatment for abuse.   -Injury prevention: Discussed safety belts, safety helmets, smoke detector, smoking near bedding or upholstery.   -Sexuality: Discussed sexually transmitted diseases, partner selection, use of condoms, avoidance of unintended pregnancy and contraceptive alternatives.   -Dental health: Discussed importance of regular tooth brushing, flossing, and dental  visits.  -Health maintenance and immunizations reviewed. Please refer to Health maintenance section.  Return to care in 1 year for next preventative visit.     Subjective:  HPI:  She has no acute complaints today. Patient is here today for her annual physical.  See assessment / plan for status of chronic conditions.  Discussed the use of AI scribe software for clinical note transcription with the patient, who gave verbal consent to proceed.  History of Present Illness Tammy Ramos is a 73 year old female who presents for an annual physical exam.  She has experienced a weight loss of approximately 8 pounds, which she attributes to a decrease in appetite following dental procedures, including deep cleaning and preparation for a tooth implant, around March or April of this year. She wonders if these dental procedures are related to her weight loss. She has not had any nausea or vomiting.  No constipation.  No diarrhea.  No fevers or chills.  No night sweats.  No early satiety.  She is on metformin  for blood sugar management. She has not experienced nausea or constipation. Her appetite has been reduced since the dental procedures.  During colder months, she engages in less physical activity, but in the summer, she is more active, working in the garden and walking with her husband. She recalls a recent trip where she was able to keep up with younger individuals during physical activities.     Lifestyle Diet: None specific.  Exercise: Not much over the winter.      01/30/2024    8:42 AM  Depression screen PHQ 2/9  Decreased Interest 0  Down, Depressed, Hopeless 0  PHQ -  2 Score 0    Health Maintenance Due  Topic Date Due   Bone Density Scan  03/06/2023     ROS: Per HPI, otherwise a complete review of systems was negative.   PMH:  The following were reviewed and entered/updated in epic: Past Medical History:  Diagnosis Date   Diabetes mellitus without complication (HCC)     High cholesterol    Hypertension    Stroke (HCC)    2019   Patient Active Problem List   Diagnosis Date Noted   Leg edema 07/21/2023   Stroke (HCC)    Allergic rhinitis 01/07/2022   Lung nodule 01/07/2022   Dyslipidemia 01/07/2022   Osteoarthritis 01/09/2019   Prediabetes 10/12/2018   Neuropathic pain 10/12/2018   History of stroke 10/02/2018   Essential hypertension 10/02/2018   Past Surgical History:  Procedure Laterality Date   HEMORRHOID SURGERY      Family History  Problem Relation Age of Onset   Heart disease Brother    Heart failure Brother    Heart attack Neg Hx    Stroke Neg Hx    Colon cancer Neg Hx    Colon polyps Neg Hx    Esophageal cancer Neg Hx    Rectal cancer Neg Hx    Stomach cancer Neg Hx     Medications- reviewed and updated Current Outpatient Medications  Medication Sig Dispense Refill   amLODipine  (NORVASC ) 10 MG tablet TAKE 1 TABLET BY MOUTH EVERY DAY 90 tablet 3   aspirin 81 MG chewable tablet Chew by mouth daily.     atorvastatin  (LIPITOR) 80 MG tablet TAKE 1 TABLET BY MOUTH EVERYDAY AT BEDTIME 90 tablet 3   ezetimibe  (ZETIA ) 10 MG tablet Take 1 tablet (10 mg total) by mouth daily. 90 tablet 3   fenofibrate  (TRICOR ) 145 MG tablet Take 1 tablet (145 mg total) by mouth daily. 90 tablet 3   metFORMIN  (GLUCOPHAGE -XR) 750 MG 24 hr tablet TAKE 1 TABLET BY MOUTH EVERY DAY WITH BREAKFAST 90 tablet 3   pantoprazole  (PROTONIX ) 40 MG tablet TAKE 1 TABLET BY MOUTH EVERY DAY IN THE MORNING 90 tablet 3   No current facility-administered medications for this visit.    Allergies-reviewed and updated Allergies[1]  Social History   Socioeconomic History   Marital status: Married    Spouse name: Not on file   Number of children: 1   Years of education: Not on file   Highest education level: Not on file  Occupational History   Not on file  Tobacco Use   Smoking status: Never    Passive exposure: Never   Smokeless tobacco: Never  Vaping Use    Vaping status: Never Used  Substance and Sexual Activity   Alcohol use: Never   Drug use: Never   Sexual activity: Not on file  Other Topics Concern   Not on file  Social History Narrative   Not on file   Social Drivers of Health   Tobacco Use: Low Risk (08/04/2023)   Patient History    Smoking Tobacco Use: Never    Smokeless Tobacco Use: Never    Passive Exposure: Never  Financial Resource Strain: Low Risk (05/23/2023)   Overall Financial Resource Strain (CARDIA)    Difficulty of Paying Living Expenses: Not hard at all  Food Insecurity: No Food Insecurity (05/23/2023)   Hunger Vital Sign    Worried About Running Out of Food in the Last Year: Never true    Ran Out of Food in the  Last Year: Never true  Transportation Needs: No Transportation Needs (05/23/2023)   PRAPARE - Administrator, Civil Service (Medical): No    Lack of Transportation (Non-Medical): No  Physical Activity: Sufficiently Active (05/23/2023)   Exercise Vital Sign    Days of Exercise per Week: 5 days    Minutes of Exercise per Session: 30 min  Stress: No Stress Concern Present (05/23/2023)   [employer] of Occupational Health - Occupational Stress Questionnaire    Feeling of Stress : Not at all  Social Connections: Moderately Isolated (05/23/2023)   Social Connection and Isolation Panel    Frequency of Communication with Friends and Family: More than three times a week    Frequency of Social Gatherings with Friends and Family: More than three times a week    Attends Religious Services: Never    Database Administrator or Organizations: No    Attends Banker Meetings: Never    Marital Status: Married  Depression (PHQ2-9): Low Risk (01/30/2024)   Depression (PHQ2-9)    PHQ-2 Score: 0  Alcohol Screen: Low Risk (05/23/2023)   Alcohol Screen    Last Alcohol Screening Score (AUDIT): 0  Housing: Unknown (05/23/2023)   Housing Stability Vital Sign    Unable to Pay for Housing in the Last  Year: No    Number of Times Moved in the Last Year: Not on file    Homeless in the Last Year: No  Utilities: Not At Risk (05/23/2023)   [employer] Utilities    Threatened with loss of utilities: No  Health Literacy: Adequate Health Literacy (05/23/2023)   B1300 Health Literacy    Frequency of need for help with medical instructions: Never        Objective:  Physical Exam: BP 120/68   Pulse 64   Temp (!) 97.5 F (36.4 C) (Temporal)   Ht 5' 1 (1.549 m)   Wt 129 lb 3.2 oz (58.6 kg)   SpO2 94%   BMI 24.41 kg/m   Body mass index is 24.41 kg/m. Wt Readings from Last 3 Encounters:  01/30/24 129 lb 3.2 oz (58.6 kg)  01/13/24 133 lb 9.6 oz (60.6 kg)  08/04/23 137 lb 3.2 oz (62.2 kg)   Gen: NAD, resting comfortably HEENT: TMs normal bilaterally. OP clear. No thyromegaly noted.  CV: RRR with no murmurs appreciated Pulm: NWOB, CTAB with no crackles, wheezes, or rhonchi GI: Normal bowel sounds present. Soft, Nontender, Nondistended. MSK: no edema, cyanosis, or clubbing noted Skin: warm, dry Neuro: CN2-12 grossly intact. Strength 5/5 in upper and lower extremities. Reflexes symmetric and intact bilaterally.  Psych: Normal affect and thought content     Cosme Jacob M. Kennyth, MD 01/30/2024 9:06 AM     [1] No Known Allergies  "

## 2024-01-30 NOTE — Assessment & Plan Note (Signed)
At goal today on amlodipine 10 mg daily. 

## 2024-01-31 NOTE — Progress Notes (Signed)
 Patient read Dr. Carroll lab results via MyChart. No changes needed at this time my caret eam.   Last read by Bluford Lynnette Breen at 7:23PM on 01/30/2024.

## 2024-05-28 ENCOUNTER — Ambulatory Visit

## 2025-02-04 ENCOUNTER — Encounter: Admitting: Family Medicine
